# Patient Record
Sex: Female | Born: 1975 | Race: Black or African American | Hispanic: No | Marital: Single | Smoking: Never smoker
Health system: Southern US, Community
[De-identification: ages and names within clinical notes are randomized; demographics above are authoritative.]

## PROBLEM LIST (undated history)

## (undated) DIAGNOSIS — M1990 Unspecified osteoarthritis, unspecified site: Secondary | ICD-10-CM

## (undated) DIAGNOSIS — K5792 Diverticulitis of intestine, part unspecified, without perforation or abscess without bleeding: Secondary | ICD-10-CM

## (undated) DIAGNOSIS — I10 Essential (primary) hypertension: Secondary | ICD-10-CM

## (undated) HISTORY — PX: TUBAL LIGATION: SHX77

## (undated) HISTORY — PX: ENDOMETRIAL ABLATION: SHX621

## (undated) HISTORY — PX: CHOLECYSTECTOMY: SHX55

---

## 2014-04-29 ENCOUNTER — Encounter (HOSPITAL_COMMUNITY): Payer: Self-pay | Admitting: Emergency Medicine

## 2014-04-29 ENCOUNTER — Emergency Department (HOSPITAL_COMMUNITY)
Admission: EM | Admit: 2014-04-29 | Discharge: 2014-04-29 | Disposition: A | Discharge status: Home / Self Care | Payer: Medicaid Other | Attending: Emergency Medicine | Admitting: Emergency Medicine

## 2014-04-29 DIAGNOSIS — Z3202 Encounter for pregnancy test, result negative: Secondary | ICD-10-CM | POA: Insufficient documentation

## 2014-04-29 DIAGNOSIS — I10 Essential (primary) hypertension: Secondary | ICD-10-CM | POA: Insufficient documentation

## 2014-04-29 DIAGNOSIS — M549 Dorsalgia, unspecified: Secondary | ICD-10-CM | POA: Diagnosis not present

## 2014-04-29 DIAGNOSIS — R109 Unspecified abdominal pain: Secondary | ICD-10-CM | POA: Diagnosis not present

## 2014-04-29 HISTORY — DX: Essential (primary) hypertension: I10

## 2014-04-29 LAB — I-STAT CHEM 8, ED
BUN: 10 mg/dL (ref 6–23)
CREATININE: 1.1 mg/dL (ref 0.50–1.10)
Calcium, Ion: 1.2 mmol/L (ref 1.12–1.23)
Chloride: 105 mEq/L (ref 96–112)
Glucose, Bld: 109 mg/dL — ABNORMAL HIGH (ref 70–99)
HEMATOCRIT: 34 % — AB (ref 36.0–46.0)
HEMOGLOBIN: 11.6 g/dL — AB (ref 12.0–15.0)
POTASSIUM: 3.7 meq/L (ref 3.7–5.3)
Sodium: 139 mEq/L (ref 137–147)
TCO2: 23 mmol/L (ref 0–100)

## 2014-04-29 LAB — URINALYSIS, ROUTINE W REFLEX MICROSCOPIC
Bilirubin Urine: NEGATIVE
Glucose, UA: NEGATIVE mg/dL
HGB URINE DIPSTICK: NEGATIVE
Ketones, ur: NEGATIVE mg/dL
Nitrite: NEGATIVE
PH: 6 (ref 5.0–8.0)
Protein, ur: NEGATIVE mg/dL
SPECIFIC GRAVITY, URINE: 1.008 (ref 1.005–1.030)
UROBILINOGEN UA: 0.2 mg/dL (ref 0.0–1.0)

## 2014-04-29 LAB — URINE MICROSCOPIC-ADD ON

## 2014-04-29 LAB — PREGNANCY, URINE: Preg Test, Ur: NEGATIVE

## 2014-04-29 MED ORDER — OXYCODONE-ACETAMINOPHEN 5-325 MG PO TABS: 1.0000 | ORAL_TABLET | ORAL | Status: DC | PRN

## 2014-04-29 MED ORDER — OXYCODONE-ACETAMINOPHEN 5-325 MG PO TABS
1.0000 | ORAL_TABLET | Freq: Once | ORAL | Status: AC
  Administered 2014-04-29: 1 via ORAL
  Filled 2014-04-29: qty 1

## 2014-04-29 NOTE — ED Notes (Signed)
Patient in NAD at time of d/c,  

## 2014-04-29 NOTE — ED Provider Notes (Signed)
Medical screening examination/treatment/procedure(s) were performed by non-physician practitioner and as supervising physician I was immediately available for consultation/collaboration.    Linwood Dibbles, MD 04/29/14 1515

## 2014-04-29 NOTE — ED Notes (Signed)
Pt c/o left flank and back pain after starting new BP meds; pt denies urinary sx and sts feels like muscle pain

## 2014-04-29 NOTE — ED Provider Notes (Signed)
CSN: [id number]     Arrival date & time 04/29/14  0917 History   First MD Initiated Contact with Patient 04/29/14 1008     Chief Complaint  Patient presents with  . Flank Pain  . Back Pain     (Consider location/radiation/quality/duration/timing/severity/associated sxs/prior Treatment) The history is provided by the patient and medical records.   This is a 38 y.o. F with PMH significant for HTN, presenting to the ED for left flank pain for the past week after starting new blood pressure medication- Benicar.  Patient states she was previously taking losartan, however her BP was not adequately controlled so her PCP switched her meds.  States while taking the medication she has had persistent, aching pain of her left flank without radiation.  Denies injuries, trauma, or falls.  Denies urinary frequency, dysuria, or hematuria.  No nausea, vomiting, diarrhea. Denies abdominal pain.  BMs normal, no diarrhea, melena, or hematochezia.  No prior hx of kidney stones.  No fever, chills, sweats.  Patient states she called her primary care physician about this yesterday, physician called in a prescription for naproxen which she has taken once but made her break out in a rash so has not taken since.  VS stable on arrival.  Past Medical History  Diagnosis Date  . Hypertension    History reviewed. No pertinent past surgical history. History reviewed. No pertinent family history. History  Substance Use Topics  . Smoking status: Never Smoker   . Smokeless tobacco: Not on file  . Alcohol Use: No   OB History   Grav Para Term Preterm Abortions TAB SAB Ect Mult Living                 Review of Systems  All other systems reviewed and are negative.     Allergies  Naproxen  Home Medications   Prior to Admission medications   Not on File   BP 139/92  Pulse 101  Temp(Src) 98.3 F (36.8 C) (Oral)  Resp 20  Ht  (1.626 m)  Wt 275 lb (124.739 kg)  BMI 47.18 kg/m2  SpO2 98%  Physical  Exam  Nursing note and vitals reviewed. Constitutional: She is oriented to person, place, and time. She appears well-developed and well-nourished. No distress.  HENT:  Head: Normocephalic and atraumatic.  Mouth/Throat: Oropharynx is clear and moist.  Eyes: Conjunctivae and EOM are normal. Pupils are equal, round, and reactive to light.  Neck: Normal range of motion. Neck supple.  Cardiovascular: Normal rate, regular rhythm and normal heart sounds.   Pulmonary/Chest: Effort normal and breath sounds normal. No respiratory distress. She has no wheezes.  Abdominal: Soft. Bowel sounds are normal. There is no tenderness. There is no guarding and no CVA tenderness.  Endorses pain of left flank and CVA region, no focal tenderness  Musculoskeletal: Normal range of motion. She exhibits no edema.  Neurological: She is alert and oriented to person, place, and time.  Skin: Skin is warm and dry. She is not diaphoretic.  Psychiatric: She has a normal mood and affect.    ED Course  Procedures (including critical care time) Labs Review Labs Reviewed  URINALYSIS, ROUTINE W REFLEX MICROSCOPIC - Abnormal; Notable for the following:    Leukocytes, UA TRACE (*)    All other components within normal limits  URINE MICROSCOPIC-ADD ON - Abnormal; Notable for the following:    Bacteria, UA FEW (*)    All other components within normal limits  I-STAT CHEM 8, ED -  Abnormal; Notable for the following:    Glucose, Bld 109 (*)    Hemoglobin 11.6 (*)    HCT 34.0 (*)    All other components within normal limits  PREGNANCY, URINE    Imaging Review No results found.   EKG Interpretation None      MDM   Final diagnoses:  Left flank pain   38 y.o. F with left flank pain after starting benicar 1 week ago.  Denies urinary sx, fever, or hx of kidney stones.  On exam, patient afebrile and non-toxic appearing.  Endorses pain of left flank but no focal tenderness.  Given change in BP meds, will send u/a and  check renal function.    Lab work reassuring.  According to benicar side effect profile, may cause back pain as well as muscle aches which may be what patient is experiencing.  After dose of percocet she is feeling much better.  Will d/c home with same.  She states she is unhappy with her PCP and would like to change, resource guide attached to help with this.  I have encouraged her to stay complaint with her BP meds until sees new physician.  Discussed plan with patient, he/she acknowledged understanding and agreed with plan of care.  Return precautions given for new or worsening symptoms.  Garlon Hatchet, PA-C 04/29/14 1210

## 2014-04-29 NOTE — Discharge Instructions (Signed)
Take the prescribed medication as directed. Follow-up with a new primary care physician in the area if you wish.  Resource guide attached to help with this.  [place] also have primary care services. Return to the ED for new or worsening symptoms.   Emergency Department Resource Guide 1) Find a Doctor and Pay Out of Pocket Although you won't have to find out who is covered by your insurance plan, it is a good idea to ask around and get recommendations. You will then need to call the office and see if the doctor you have chosen will accept you as a new patient and what types of options they offer for patients who are self-pay. Some doctors offer discounts or will set up payment plans for their patients who do not have insurance, but you will need to ask so you aren't surprised when you get to your appointment.  2) Contact Your Local Health Department Not all health departments have doctors that can see patients for sick visits, but many do, so it is worth a call to see if yours does. If you don't know where your local health department is, you can check in your phone book. The CDC also has a tool to help you locate your state's health department, and many state websites also have listings of all of their local health departments.  3) Find a Walk-in Clinic If your illness is not likely to be very severe or complicated, you may want to try a walk in clinic. These are popping up all over the country in pharmacies, drugstores, and shopping centers. They're usually staffed by nurse practitioners or physician assistants that have been trained to treat common illnesses and complaints. They're usually fairly quick and inexpensive. However, if you have serious medical issues or chronic medical problems, these are probably not your best option.  No Primary Care Doctor: - Call Health Connect at  [phone] - they can help you locate a primary care doctor that  accepts your insurance, provides certain  services, etc. - Physician Referral Service- [phone]  Chronic Pain Problems: Organization         Address  Phone   Notes  [facility]  [phone] Patients need to be referred by their primary care doctor.   Medication Assistance: Organization         Address  Phone   Notes  [facility] Medication [facility] [address] [place] [place], [place] [address] [phone] --Must be a resident of [facility] -- Must have NO insurance coverage whatsoever (no Medicaid/ Medicare, etc.) -- The pt. MUST have a primary care doctor that directs their care regularly and follows them in the community   [facility]ist  [phone]   [employer]  [phone]    Agencies that provide inexpensive medical care: Organization         Address  Phone   Notes  [facility]  [phone]   [facility]    [phone]   [facility] [address] [place], [place] [address] [phone]   [facility] [address] [place] [place] [phone]   Planned Parenthood    [phone]   [facility]    [phone]   [facility]  [address], [place] Phone:  [phone], Fax:  [phone] Hours of Operation:  9 am - 6 pm, M-F.  Also accepts Medicaid/Medicare and self-pay.  [facility] for [place] [place], [address], [place] Phone: [phone], Fax: [phone]. Hours of Operation:  8:30 am - 5:30 pm, M-F.  Also accepts Medicaid and self-pay.  [facility] [address] [place] Phone: [phone]   [place], [place], [place] [phone], Ext. [phone] Mondays & Thursdays: 7-9 AM.  First 15 patients are seen on a first come, first serve basis.    [place] Providers:  Organization         Address  Phone   Notes  [facility] [address], [place] [phone] Also accepts self-pay patients.  [facility] [address] [place], [place]  [phone]   [place], [address], [place] [phone]   [facility] [address], [place] [phone]   Lucianne Lei [address], [place]   [phone] Only accepts [place] Access [place] patients after they have their name applied to their card.   Self-Pay (no insurance) in [facility] Xxx:  Organization         Address  Phone   Notes  Sickle Cell Patients, [facility] Internal Medicine [place] [phone]   [facility] [place] [phone]   [facility] [place]  [place], [place], [place] [phone]   [facility]/Dr. Osei-Bonsu  [address] [place] or [address], [place] [phone] Phone number for both [place] and [place] locations is the same.  Urgent Medical and [facility] [address], [place] [phone]   [facility] [address] [place] or [address] [phone]   [facility] [address] [place] [phone], phone; [phone], fax Sees patients 1st and 3rd Saturday of every month.  Must not qualify for public or private insurance (i.e. Medicaid, Medicare, [place] Health Choice, Veterans' Benefits)  Household income should be no more than 200% of the poverty level The clinic cannot treat you if you are pregnant or think you are pregnant  Sexually transmitted diseases are not treated at the clinic.    Dental Care: Organization         Address  Phone  Notes  [facility] Department of [facility] [phone] Accepts  children up to age 57 who are enrolled in [place] or [place]; pregnant women with a Medicaid card; and children who have applied for Medicaid or [place] Health Choice, but were declined, whose parents can pay a reduced fee at time of service.  [facility] Department of [facility]  [address], [place] [phone] Accepts children up to age 80 who are enrolled in [place] or [place]; pregnant women with a Medicaid card; and children who have applied for Medicaid or [place] Health Choice, but were declined, whose parents can pay a reduced fee at time of service.  [facility] PRO[facility]  [place] [phone] Patients are seen by appointment only. Walk-ins are not accepted. [place] will see patients 42 years of age and older. Monday - Tuesday (  8am-5pm) Most Wednesdays (8:30-5pm) $30 per visit, cash only  [facility] Adult Dental Access PROGRAM  [address], [facility] [phone] Patients are seen by appointment only. Walk-ins are not accepted. [place] will see patients 78 years of age and older. One Wednesday Evening (Monthly: Volunteer Based).  $30 per visit, cash only  [place]  [phone] for adults; Children under age 55, call Graduate Pediatric Dentistry at [phone]. Children aged 17-14, please call [phone] to request a pediatric application.  Dental services are provided in all areas of dental care including fillings, crowns and bridges, complete and partial dentures, implants, gum treatment, root canals, and extractions. Preventive care is also provided. Treatment is provided to both adults and children. Patients are selected via a lottery and there is often a waiting list.   [facility] [address], [place]  [phone] [email or web address]   [facility] [address] [place], [place] [phone], Ext. [phone] Second and  Fourth Thursday of each month, opens at 6:30 AM; Clinic ends at 9 AM.  Patients are seen on a first-come first-served basis, and a limited number are seen during each clinic.   [facility]  [address] Hillard Danker [place], [place] [phone]   Eligibility Requirements You must have lived in [place], [place], or [place] counties for at least the last three months.   You cannot be eligible for state or federal sponsored [employer], including [employer], [place], or [employer].   You generally cannot be eligible for healthcare insurance through your employer.    How to apply: Eligibility screenings are held every Tuesday and Wednesday afternoon from 1:00 pm until 4:00 pm. You do not need an appointment for the interview!  [facility] [address] [place], [place]   Head of the [place]  [place] Department  [place]  [phone]    Behavioral Health Resources in the Community: Intensive Outpatient Programs Organization         Address  Phone  Notes  [place]. [address] [place], [place] [phone]   [facility] [address], [place], [place]   ADS: Alcohol & Drug Svcs [address] [place], [place]   [place] [address],  [place], [place] or [phone]   Substance Abuse Resources Organization         Address  Phone  Notes  Alcohol and Drug Services  [phone]   [place]  [phone]   [facility] [place]   Chinita Pester  [phone]   Residential & Outpatient Substance Abuse Program  [phone]   Psychological Services Organization         Address  Phone  Notes  [facility] [place]  [place]  [phone]   [place]  [address], [place] or [phone]    Mobile Crisis Teams Organization         Address  Phone  Notes  [facility], Mobile Crisis Care Unit  [phone]   [facility]  [address] [place], [place]   [place] [address], [place] [phone]    Self-Help/Support Groups Organization         Address  Phone  Notes  [facility]ental [facility]oc[facility] of [place] - variety of support groups  336- I7437963 Call for more information  Narcotics Anonymous (NA), [facility] [address], [employer] [place]  2 meetings at this location   Statistician         Address  Phone  Notes  [facility] [address],    [place]  [phone]   [facility]  [address] [place] [address], [place], [place] [phone]   [facility] [address] [place], [place] [phone] Admissions: 8am-3pm M-F  [facility] [address],    [place], [place] [phone]   [facility] [address] [place], [place], [place] [phone]   [facility] [address],  [place], [place] [phone]   [facility] - Intensive Outpatient [address], [place], [place] [phone]   [facility] (Addiction Recovery [facility] [address] [place].,  [place], [place] [phone] or [phone]   [facility] Services (RTS) [address], [place], [place] [phone] Accepts Medicaid  [facility] [place] [address]  [place] [phone] Substance Abuse/Addiction Treatment   [facility] Organization         Address  Phone  Notes  [facility]  [phone]   Angie Fava, PhD [address] Ervin Knack [place], [place]   [phone] or [phone]   [facility] Behavioral   [address] [place], [place] [phone]   [facility] [address] [place], [place] [phone] Insurance/Medicaid/sponsorship through [facility] and Families [address]                                    [place], [place] [phone] Therapy/tele-psych/case  [facility] [address] [place] [address], [place] [phone]    Dr. Lolly Mustache  [phone]   [facility]  [employer] [facility] Dept. 1) [address], [place] 2) [address], [place] 3)  [address], [place] [phone]  [phone]   [facility] Child Abuse Hotline 330-224-7062 or [phone] (After Hours)

## 2014-04-30 ENCOUNTER — Emergency Department (HOSPITAL_COMMUNITY): Payer: Medicaid Other

## 2014-04-30 ENCOUNTER — Emergency Department (HOSPITAL_COMMUNITY)
Admission: EM | Admit: 2014-04-30 | Discharge: 2014-04-30 | Disposition: A | Discharge status: Home / Self Care | Payer: Medicaid Other | Attending: Emergency Medicine | Admitting: Emergency Medicine

## 2014-04-30 ENCOUNTER — Encounter (HOSPITAL_COMMUNITY): Payer: Self-pay | Admitting: Neurology

## 2014-04-30 DIAGNOSIS — Z79899 Other long term (current) drug therapy: Secondary | ICD-10-CM | POA: Insufficient documentation

## 2014-04-30 DIAGNOSIS — I10 Essential (primary) hypertension: Secondary | ICD-10-CM | POA: Insufficient documentation

## 2014-04-30 DIAGNOSIS — F8081 Childhood onset fluency disorder: Secondary | ICD-10-CM

## 2014-04-30 DIAGNOSIS — R200 Anesthesia of skin: Secondary | ICD-10-CM

## 2014-04-30 DIAGNOSIS — R209 Unspecified disturbances of skin sensation: Secondary | ICD-10-CM | POA: Insufficient documentation

## 2014-04-30 DIAGNOSIS — R4701 Aphasia: Secondary | ICD-10-CM | POA: Insufficient documentation

## 2014-04-30 LAB — CBC
HEMATOCRIT: 33.1 % — AB (ref 36.0–46.0)
Hemoglobin: 11.2 g/dL — ABNORMAL LOW (ref 12.0–15.0)
MCH: 27.3 pg (ref 26.0–34.0)
MCHC: 33.8 g/dL (ref 30.0–36.0)
MCV: 80.5 fL (ref 78.0–100.0)
Platelets: 235 10*3/uL (ref 150–400)
RBC: 4.11 MIL/uL (ref 3.87–5.11)
RDW: 13.9 % (ref 11.5–15.5)
WBC: 6.4 10*3/uL (ref 4.0–10.5)

## 2014-04-30 LAB — COMPREHENSIVE METABOLIC PANEL
ALT: 15 U/L (ref 0–35)
AST: 18 U/L (ref 0–37)
Albumin: 3.3 g/dL — ABNORMAL LOW (ref 3.5–5.2)
Alkaline Phosphatase: 74 U/L (ref 39–117)
Anion gap: 11 (ref 5–15)
BILIRUBIN TOTAL: 0.2 mg/dL — AB (ref 0.3–1.2)
BUN: 11 mg/dL (ref 6–23)
CALCIUM: 9 mg/dL (ref 8.4–10.5)
CO2: 24 mEq/L (ref 19–32)
Chloride: 104 mEq/L (ref 96–112)
Creatinine, Ser: 1.11 mg/dL — ABNORMAL HIGH (ref 0.50–1.10)
GFR, EST AFRICAN AMERICAN: 72 mL/min — AB (ref >90)
GFR, EST NON AFRICAN AMERICAN: 62 mL/min — AB (ref >90)
GLUCOSE: 105 mg/dL — AB (ref 70–99)
Potassium: 3.6 mEq/L — ABNORMAL LOW (ref 3.7–5.3)
SODIUM: 139 meq/L (ref 137–147)
Total Protein: 7.1 g/dL (ref 6.0–8.3)

## 2014-04-30 LAB — URINE MICROSCOPIC-ADD ON

## 2014-04-30 LAB — URINALYSIS, ROUTINE W REFLEX MICROSCOPIC
BILIRUBIN URINE: NEGATIVE
Glucose, UA: NEGATIVE mg/dL
Hgb urine dipstick: NEGATIVE
Ketones, ur: NEGATIVE mg/dL
NITRITE: NEGATIVE
PH: 6 (ref 5.0–8.0)
Protein, ur: NEGATIVE mg/dL
SPECIFIC GRAVITY, URINE: 1.008 (ref 1.005–1.030)
Urobilinogen, UA: 0.2 mg/dL (ref 0.0–1.0)

## 2014-04-30 LAB — CBG MONITORING, ED: Glucose-Capillary: 107 mg/dL — ABNORMAL HIGH (ref 70–99)

## 2014-04-30 LAB — RAPID URINE DRUG SCREEN, HOSP PERFORMED
Amphetamines: NOT DETECTED
BARBITURATES: NOT DETECTED
Benzodiazepines: NOT DETECTED
COCAINE: NOT DETECTED
Opiates: NOT DETECTED
Tetrahydrocannabinol: NOT DETECTED

## 2014-04-30 LAB — DIFFERENTIAL
Basophils Absolute: 0 10*3/uL (ref 0.0–0.1)
Basophils Relative: 0 % (ref 0–1)
EOS PCT: 1 % (ref 0–5)
Eosinophils Absolute: 0.1 10*3/uL (ref 0.0–0.7)
Lymphocytes Relative: 23 % (ref 12–46)
Lymphs Abs: 1.5 10*3/uL (ref 0.7–4.0)
Monocytes Absolute: 0.4 10*3/uL (ref 0.1–1.0)
Monocytes Relative: 6 % (ref 3–12)
Neutro Abs: 4.5 10*3/uL (ref 1.7–7.7)
Neutrophils Relative %: 70 % (ref 43–77)

## 2014-04-30 LAB — I-STAT CHEM 8, ED
BUN: 10 mg/dL (ref 6–23)
CREATININE: 1.2 mg/dL — AB (ref 0.50–1.10)
Calcium, Ion: 1.35 mmol/L — ABNORMAL HIGH (ref 1.12–1.23)
Chloride: 102 mEq/L (ref 96–112)
GLUCOSE: 109 mg/dL — AB (ref 70–99)
HCT: 37 % (ref 36.0–46.0)
HEMOGLOBIN: 12.6 g/dL (ref 12.0–15.0)
Potassium: 3.4 mEq/L — ABNORMAL LOW (ref 3.7–5.3)
SODIUM: 141 meq/L (ref 137–147)
TCO2: 24 mmol/L (ref 0–100)

## 2014-04-30 LAB — I-STAT TROPONIN, ED: Troponin i, poc: 0 ng/mL (ref 0.00–0.08)

## 2014-04-30 LAB — APTT: aPTT: 32 seconds (ref 24–37)

## 2014-04-30 LAB — PROTIME-INR
INR: 1.16 (ref 0.00–1.49)
Prothrombin Time: 14.8 seconds (ref 11.6–15.2)

## 2014-04-30 LAB — ETHANOL: Alcohol, Ethyl (B): <11 mg/dL (ref 0–11)

## 2014-04-30 MED ORDER — METOCLOPRAMIDE HCL 5 MG/ML IJ SOLN: 10.0000 mg | Freq: Once | INTRAMUSCULAR | Status: DC

## 2014-04-30 MED ORDER — METOCLOPRAMIDE HCL 5 MG/ML IJ SOLN
10.0000 mg | Freq: Once | INTRAMUSCULAR | Status: AC
  Administered 2014-04-30: 10 mg via INTRAMUSCULAR
  Filled 2014-04-30: qty 2

## 2014-04-30 NOTE — Discharge Instructions (Signed)
Recommend following up with a new PCP in the area as we discussed yesterday. Return to the ED for new concerns.

## 2014-04-30 NOTE — ED Notes (Signed)
Patient in NAD at time of d/c 

## 2014-04-30 NOTE — ED Notes (Signed)
PT lsn 1230 today.  States just started taking benicar yesterday and at 1230 began having difficulty speaking and R leg and R face numbness.

## 2014-04-30 NOTE — Code Documentation (Signed)
38yo female arriving to [facility] via private vehicle at 1239.  Patient reports that she was riding in the car with a neighbor when she had sudden onset bilateral jaw tingling and stuttering speech.  Patient reports that she was seen by a physician yesterday for back pain and was prescribed an antihypertensive.  Patient took the new medication this morning at 1000.  Patient taken to CT on arrival.  Initial NIHSS 0.  Patient initially stuttering with speech, but now speech is clear.  When back to room patient again having stuttering speech with neighbor now at the bedside.  Dr. Amada Jupiter to room and speech again clear.  Patient reporting recent stressors to MD as well as headaches.  Patient to get MRI.  Code Stroke canceled per MD.  Bedside handoff with ED RN Lorin Picket.

## 2014-04-30 NOTE — ED Provider Notes (Signed)
Care assumed from Sharilyn Sites, PA-C.  Sabrina Bond is a 38 y.o. female presents with hx of HTN and c/o R leg and R face numbness onset 12:30PM immediately after taking her Benicar. Pt was seen and evaluated for myalgias and flank pain yesterday likely 2/2 to her Benicar usage.    Physical Exam  BP 141/72  Pulse 67  Temp(Src) 98.1 F (36.7 C) (Oral)  Resp 12  SpO2 100%  Physical Exam  Face to face Exam:   General: Awake  HEENT: Atraumatic  Resp: Normal effort  Abd: Nondistended  Neuro:No focal weakness  Lymph: No adenopathy MSK: moving all extremities equally without ataxia    ED Course  Procedures  Results for orders placed during the hospital encounter of 04/30/14  ETHANOL      Result Value Ref Range   Alcohol, Ethyl (B) <11  0 - 11 mg/dL  PROTIME-INR      Result Value Ref Range   Prothrombin Time 14.8  11.6 - 15.2 seconds   INR 1.16  0.00 - 1.49  APTT      Result Value Ref Range   aPTT 32  24 - 37 seconds  CBC      Result Value Ref Range   WBC 6.4  4.0 - 10.5 K/uL   RBC 4.11  3.87 - 5.11 MIL/uL   Hemoglobin 11.2 (*) 12.0 - 15.0 g/dL   HCT 40.9 (*) 81.1 - 91.4 %   MCV 80.5  78.0 - 100.0 fL   MCH 27.3  26.0 - 34.0 pg   MCHC 33.8  30.0 - 36.0 g/dL   RDW 78.2  95.6 - 21.3 %   Platelets 235  150 - 400 K/uL  DIFFERENTIAL      Result Value Ref Range   Neutrophils Relative % 70  43 - 77 %   Neutro Abs 4.5  1.7 - 7.7 K/uL   Lymphocytes Relative 23  12 - 46 %   Lymphs Abs 1.5  0.7 - 4.0 K/uL   Monocytes Relative 6  3 - 12 %   Monocytes Absolute 0.4  0.1 - 1.0 K/uL   Eosinophils Relative 1  0 - 5 %   Eosinophils Absolute 0.1  0.0 - 0.7 K/uL   Basophils Relative 0  0 - 1 %   Basophils Absolute 0.0  0.0 - 0.1 K/uL  COMPREHENSIVE METABOLIC PANEL      Result Value Ref Range   Sodium 139  137 - 147 mEq/L   Potassium 3.6 (*) 3.7 - 5.3 mEq/L   Chloride 104  96 - 112 mEq/L   CO2 24  19 - 32 mEq/L   Glucose, Bld 105 (*) 70 - 99 mg/dL   BUN 11  6 - 23 mg/dL   Creatinine, Ser 0.86 (*) 0.50 - 1.10 mg/dL   Calcium 9.0  8.4 - 57.8 mg/dL   Total Protein 7.1  6.0 - 8.3 g/dL   Albumin 3.3 (*) 3.5 - 5.2 g/dL   AST 18  0 - 37 U/L   ALT 15  0 - 35 U/L   Alkaline Phosphatase 74  39 - 117 U/L   Total Bilirubin 0.2 (*) 0.3 - 1.2 mg/dL   GFR calc non Af Amer 62 (*) >90 mL/min   GFR calc Af Amer 72 (*) >90 mL/min   Anion gap 11  5 - 15  I-STAT CHEM 8, ED      Result Value Ref Range   Sodium 141  137 -  147 mEq/L   Potassium 3.4 (*) 3.7 - 5.3 mEq/L   Chloride 102  96 - 112 mEq/L   BUN 10  6 - 23 mg/dL   Creatinine, Ser 1.61 (*) 0.50 - 1.10 mg/dL   Glucose, Bld 096 (*) 70 - 99 mg/dL   Calcium, Ion 0.45 (*) 1.12 - 1.23 mmol/L   TCO2 24  0 - 100 mmol/L   Hemoglobin 12.6  12.0 - 15.0 g/dL   HCT 40.9  81.1 - 91.4 %  I-STAT TROPOININ, ED      Result Value Ref Range   Troponin i, poc 0.00  0.00 - 0.08 ng/mL   Comment 3           CBG MONITORING, ED      Result Value Ref Range   Glucose-Capillary 107 (*) 70 - 99 mg/dL   Comment 1 Documented in Chart     Comment 2 Notify RN     Ct Head Wo Contrast  04/30/2014   CLINICAL DATA:  Right facial numbness. Right lower extremity numbness. Slurred speech.  EXAM: CT HEAD WITHOUT CONTRAST  TECHNIQUE: Contiguous axial images were obtained from the base of the skull through the vertex without intravenous contrast.  COMPARISON:  None.  FINDINGS: No acute intracranial abnormality. Specifically, no hemorrhage, hydrocephalus, mass lesion, acute infarction, or significant intracranial injury. No acute calvarial abnormality. Visualized paranasal sinuses and mastoids clear. Orbital soft tissues unremarkable.  IMPRESSION: Negative.   Electronically Signed   By: Charlett Nose M.D.   On: 04/30/2014 13:02   Mr Brain Wo Contrast  04/30/2014   CLINICAL DATA:  Stuttering speech in right leg and facial numbness  EXAM: MRI HEAD WITHOUT CONTRAST  TECHNIQUE: Multiplanar, multiecho pulse sequences of the brain and surrounding structures were  obtained without intravenous contrast.  COMPARISON:  Head CT from the same day  FINDINGS: Calvarium and upper cervical spine: No marrow signal abnormality.  Orbits: No significant findings.  Sinuses: Clear. Mastoid and middle ears are clear.  Brain: No acute abnormality such as acute infarct, hemorrhage, hydrocephalus, or mass lesion. The major intracranial vessels appear diminutive, especially on axial T2 weighted imaging. Appearance on coronal imaging and diffuse symmetry argues against a focal dissection or Moya moya. Mild cerebellar tonsillar ectopia. No foramen magnum crowding typical of Chiari 1 malformation.  The suprachiasmatic recesses is prominent but there is no evidence of third ventricular obstruction or other suprasellar abnormality. This could represent a small intraventricular cyst.  IMPRESSION: No infarct or other acute intracranial disease.   Electronically Signed   By: Tiburcio Pea M.D.   On: 04/30/2014 16:07      MDM Presents with c/o aphasia, and RUE, RLE weakness.  Pt with normal neurologic exam on initial evaluation and ability to speak in full sentences.    Plan: Pt has been seen by Neurology who recommends MRI and d/c home if negative.    4:27 PM MRI negative.  Pt to be d/c home with neurology follow-up.    BP 141/72  Pulse 67  Temp(Src) 98.1 F (36.7 C) (Oral)  Resp 12  SpO2 100%          Dierdre Forth, PA-C 05/01/14 [phone]

## 2014-04-30 NOTE — ED Provider Notes (Signed)
CSN: [id number]     Arrival date & time 04/30/14  1239 History   First MD Initiated Contact with Patient 04/30/14 1251     Chief Complaint  Patient presents with  . Aphasia     (Consider location/radiation/quality/duration/timing/severity/associated sxs/prior Treatment) The history is provided by the patient and medical records.   This is a 38 year old female with past medical history significant for hypertension, presenting to the ED for stuttering speech and right leg and facial numbness. CODE STROKE activated in triage. Patient was last seen normal at 12:30 today.  Patient familiar to myself from ED visit yesterday. She was recently switched from Losartan to Benicar by PCP due to inadequate control of blood pressure.  After yesterday's visit, recommended continuing her Benicar as risk outweighs benefit at this time.  Patient states she took her last dose around 1230 and shortly afterwards developed aphasia and word finding difficulties, as well as right facial and leg numbness.  Denies unilateral weakness.  No chest pain, SOB, palpitations, dizziness, weakness, visual disturbance.  Past Medical History  Diagnosis Date  . Hypertension    No past surgical history on file. No family history on file. History  Substance Use Topics  . Smoking status: Never Smoker   . Smokeless tobacco: Not on file  . Alcohol Use: No   OB History   Grav Para Term Preterm Abortions TAB SAB Ect Mult Living                 Review of Systems  Neurological: Positive for speech difficulty and numbness.  All other systems reviewed and are negative.     Allergies  Naproxen  Home Medications   Prior to Admission medications   Medication Sig Start Date End Date Taking? Authorizing Provider  esomeprazole (NEXIUM) 40 MG capsule Take 40 mg by mouth daily as needed.    Historical Provider, MD  olmesartan-hydrochlorothiazide (BENICAR HCT) 40-12.5 MG per tablet Take 1 tablet by mouth daily.    Historical  Provider, MD  oxyCODONE-acetaminophen (PERCOCET/ROXICET) 5-325 MG per tablet Take 1 tablet by mouth every 4 (four) hours as needed. 04/29/14   Garlon Hatchet, PA-C   There were no vitals taken for this visit.  Physical Exam  Nursing note and vitals reviewed. Constitutional: She is oriented to person, place, and time. She appears well-developed and well-nourished. No distress.  HENT:  Head: Normocephalic and atraumatic.  Mouth/Throat: Oropharynx is clear and moist.  Eyes: Conjunctivae and EOM are normal. Pupils are equal, round, and reactive to light.  Neck: Normal range of motion. Neck supple.  Cardiovascular: Normal rate, regular rhythm and normal heart sounds.   Pulmonary/Chest: Effort normal and breath sounds normal. No respiratory distress. She has no wheezes.  Abdominal: Soft. Bowel sounds are normal. There is no tenderness. There is no guarding.  Musculoskeletal: Normal range of motion.  Neurological: She is alert and oriented to person, place, and time.  AAOx3, answering questions and following commands appropriately; equal strength UE and LE bilaterally; CN grossly intact; moves all extremities appropriately without ataxia; normal coordination; no dysmetria with finger to nose bilaterally; no focal neuro deficits or facial asymmetry appreciated; normal sensation throughout  Skin: Skin is warm and dry. She is not diaphoretic.  Psychiatric: She has a normal mood and affect.    ED Course  Procedures (including critical care time) Labs Review Labs Reviewed  CBC - Abnormal; Notable for the following:    Hemoglobin 11.2 (*)    HCT 33.1 (*)  All other components within normal limits  COMPREHENSIVE METABOLIC PANEL - Abnormal; Notable for the following:    Potassium 3.6 (*)    Glucose, Bld 105 (*)    Creatinine, Ser 1.11 (*)    Albumin 3.3 (*)    Total Bilirubin 0.2 (*)    GFR calc non Af Amer 62 (*)    GFR calc Af Amer 72 (*)    All other components within normal limits   I-STAT CHEM 8, ED - Abnormal; Notable for the following:    Potassium 3.4 (*)    Creatinine, Ser 1.20 (*)    Glucose, Bld 109 (*)    Calcium, Ion 1.35 (*)    All other components within normal limits  CBG MONITORING, ED - Abnormal; Notable for the following:    Glucose-Capillary 107 (*)    All other components within normal limits  ETHANOL  PROTIME-INR  APTT  DIFFERENTIAL  URINE RAPID DRUG SCREEN (HOSP PERFORMED)  URINALYSIS, ROUTINE W REFLEX MICROSCOPIC  I-STAT TROPOININ, ED  I-STAT TROPOININ, ED    Imaging Review Ct Head Wo Contrast  04/30/2014   CLINICAL DATA:  Right facial numbness. Right lower extremity numbness. Slurred speech.  EXAM: CT HEAD WITHOUT CONTRAST  TECHNIQUE: Contiguous axial images were obtained from the base of the skull through the vertex without intravenous contrast.  COMPARISON:  None.  FINDINGS: No acute intracranial abnormality. Specifically, no hemorrhage, hydrocephalus, mass lesion, acute infarction, or significant intracranial injury. No acute calvarial abnormality. Visualized paranasal sinuses and mastoids clear. Orbital soft tissues unremarkable.  IMPRESSION: Negative.   Electronically Signed   By: Charlett Nose M.D.   On: 04/30/2014 13:02     EKG Interpretation None      MDM   Final diagnoses:  Numbness  Stuttering   38 year old female last seen normal at 12:30, presenting with stuttering speech and right facial and leg numbness. Neurologic exam is nonfocal, stroke score 0.  CT head negative for acute findings.  Lab work is reassuring.  Neurology has evaluated patient, all sx seem to have resolved at this time.  Were planning for discharge home, however patient began complaining of recurrent numbness and stuttering speech.  Will plan for MRI.  Per neurology, if MRI negative, safe to be discharged home without further work-up.    Care signed out to PA Muthersbaugh at change of shift.  Will follow MRI results and dispo accordingly.  Garlon Hatchet,  PA-C 04/30/14 1536  Garlon Hatchet, PA-C 04/30/14 1537

## 2014-04-30 NOTE — Consult Note (Signed)
Referring Physician: Littie Deeds    Chief Complaint:  Code stroke  HPI:                                                                                                                                         Sabrina Bond is an 38 y.o. female who woke up this AM feeling fine.  She took her medication at 1000 AM and about one hour later got in car with friend.  She became light headed in car.  On the way she was trying to tell friend her feet were tingling but could not get words out.  She then noted bilateral lower jaw felt tingling.  Her friend brought her to ED.  On arrival she showed stuttering speech at times and at other times she would speak clearly. Her jaw numbness and lower extremity numbness has resolved.   She does endorse occasional severe headaches that are unilateral and throbbing in character associated with photophobia. She denies previous aura.  Date last known well: Date: 04/30/2014 Time last known well: Time: 12:20 tPA Given: No: resolving symptoms  Past Medical History  Diagnosis Date  . Hypertension     No past surgical history on file.  Family History  Problem Relation Age of Onset  . Hypertension Mother   . Hypertension Father    Social History:  reports that she has never smoked. She does not have any smokeless tobacco history on file. She reports that she does not drink alcohol or use illicit drugs.  Allergies:  Allergies  Allergen Reactions  . Naproxen Itching and Rash    Medications:                                                                                                                           No current facility-administered medications for this encounter.   Current Outpatient Prescriptions  Medication Sig Dispense Refill  . esomeprazole (NEXIUM) 40 MG capsule Take 40 mg by mouth daily as needed.      Marland Kitchen olmesartan-hydrochlorothiazide (BENICAR HCT) 40-12.5 MG per tablet Take 1 tablet by mouth daily.      Marland Kitchen oxyCODONE-acetaminophen (PERCOCET/ROXICET)  5-325 MG per tablet Take 1 tablet by mouth every 4 (four) hours as needed.  15 tablet  0     ROS:  History obtained from the patient  General ROS: negative for - chills, fatigue, fever, night sweats, weight gain or weight loss Psychological ROS: negative for - behavioral disorder, hallucinations, memory difficulties, mood swings or suicidal ideation Ophthalmic ROS: negative for - blurry vision, double vision, eye pain or loss of vision ENT ROS: negative for - epistaxis, nasal discharge, oral lesions, sore throat, tinnitus or vertigo Allergy and Immunology ROS: negative for - hives or itchy/watery eyes Hematological and Lymphatic ROS: negative for - bleeding problems, bruising or swollen lymph nodes Endocrine ROS: negative for - galactorrhea, hair pattern changes, polydipsia/polyuria or temperature intolerance Respiratory ROS: negative for - cough, hemoptysis, shortness of breath or wheezing Cardiovascular ROS: negative for - chest pain, dyspnea on exertion, edema or irregular heartbeat Gastrointestinal ROS: negative for - abdominal pain, diarrhea, hematemesis, nausea/vomiting or stool incontinence Genito-Urinary ROS: negative for - dysuria, hematuria, incontinence or urinary frequency/urgency Musculoskeletal ROS: negative for - joint swelling or muscular weakness Neurological ROS: as noted in HPI Dermatological ROS: negative for rash and skin lesion changes  Neurologic Examination:                                                                                                      There were no vitals taken for this visit.  General: NAD Mental Status: Alert, oriented, thought content appropriate.  Speech fluent without evidence of aphasia.  Able to follow 3 step commands without difficulty. Cranial Nerves: II: Discs flat bilaterally; Visual fields grossly  normal, pupils equal, round, reactive to light and accommodation III,IV, VI: ptosis not present, extra-ocular motions intact bilaterally V,VII: smile symmetric, facial light touch sensation normal bilaterally VIII: hearing normal bilaterally IX,X: gag reflex present XI: bilateral shoulder shrug XII: midline tongue extension without atrophy or fasciculations  Motor: Right : Upper extremity   5/5    Left:     Upper extremity   5/5  Lower extremity   5/5     Lower extremity   5/5 Tone and bulk:normal tone throughout; no atrophy noted Sensory: Pinprick and light touch intact throughout, bilaterally Deep Tendon Reflexes:  Right: Upper Extremity   Left: Upper extremity   biceps (C-5 to C-6) 2/4   biceps (C-5 to C-6) 2/4 tricep (C7) 2/4    triceps (C7) 2/4 Brachioradialis (C6) 2/4  Brachioradialis (C6) 2/4  Lower Extremity Lower Extremity  quadriceps (L-2 to L-4) 2/4   quadriceps (L-2 to L-4) 2/4 Achilles (S1) 2/4   Achilles (S1) 2/4  Plantars: Right: downgoing   Left: downgoing Cerebellar: normal finger-to-nose,  normal heel-to-shin test Gait: not tested CV: pulses palpable throughout    Lab Results: Basic Metabolic Panel:  Recent Labs Lab 04/29/14 1052  NA 139  K 3.7  CL 105  GLUCOSE 109*  BUN 10  CREATININE 1.10    Liver Function Tests: No results found for this basename: AST, ALT, ALKPHOS, BILITOT, PROT, ALBUMIN,  in the last 168 hours No results found for this basename: LIPASE, AMYLASE,  in the last 168 hours No results found for this basename: AMMONIA,  in the last 168 hours  CBC:  Recent Labs Lab 04/29/14 1052  HGB 11.6*  HCT 34.0*    Cardiac Enzymes: No results found for this basename: CKTOTAL, CKMB, CKMBINDEX, TROPONINI,  in the last 168 hours  Lipid Panel: No results found for this basename: CHOL, TRIG, HDL, CHOLHDL, VLDL, LDLCALC,  in the last 168 hours  CBG:  Recent Labs Lab 04/30/14 1244  GLUCAP 107*    Microbiology: No results found  for this or any previous visit.  Coagulation Studies: No results found for this basename: LABPROT, INR,  in the last 72 hours  Imaging: Ct Head Wo Contrast  04/30/2014   CLINICAL DATA:  Right facial numbness. Right lower extremity numbness. Slurred speech.  EXAM: CT HEAD WITHOUT CONTRAST  TECHNIQUE: Contiguous axial images were obtained from the base of the skull through the vertex without intravenous contrast.  COMPARISON:  None.  FINDINGS: No acute intracranial abnormality. Specifically, no hemorrhage, hydrocephalus, mass lesion, acute infarction, or significant intracranial injury. No acute calvarial abnormality. Visualized paranasal sinuses and mastoids clear. Orbital soft tissues unremarkable.  IMPRESSION: Negative.   Electronically Signed   By: Charlett Nose M.D.   On: 04/30/2014 13:02       Assessment and plan discussed with with attending physician and they are in agreement.    Felicie Morn PA-C [facility] [phone]  04/30/2014, 1:06 PM   Assessment: 38 y.o. female brought to ED as code stroke due to bilateral jaw tingling and stuttering speech.  Symptoms have now fully resolved. Head CT negative for stroke or bleed. Her stuttering speech seems psychogenic in nature, though with her headache it is possible that she has complicated migraine.  Stroke Risk Factors - hypertension  1) MRI brain, if negative no further workup 2) could use Reglan 10 mg IV x1 for presumed migraine  Ritta Slot, MD [facility] [phone]  If 7pm- 7am, please page neurology on call as listed in [facility].

## 2014-05-01 NOTE — ED Provider Notes (Signed)
Medical screening examination/treatment/procedure(s) were performed by non-physician practitioner and as supervising physician I was immediately available for consultation/collaboration.   EKG Interpretation   Date/Time:  Friday April 30 2014 13:39:09 EDT Ventricular Rate:  69 PR Interval:  151 QRS Duration: 77 QT Interval:  397 QTC Calculation: 425 R Axis:   27 Text Interpretation:  Sinus rhythm Baseline wander in lead(s) V1 No old  tracing to compare Confirmed by Mirian Mo [phone]) on 04/30/2014  1:48:14 PM       Ethelda Chick, MD 05/01/14 [phone]

## 2014-05-01 NOTE — ED Provider Notes (Signed)
Medical screening examination/treatment/procedure(s) were performed by non-physician practitioner and as supervising physician I was immediately available for consultation/collaboration.   EKG Interpretation   Date/Time:  Friday April 30 2014 13:39:09 EDT Ventricular Rate:  69 PR Interval:  151 QRS Duration: 77 QT Interval:  397 QTC Calculation: 425 R Axis:   27 Text Interpretation:  Sinus rhythm Baseline wander in lead(s) V1 No old  tracing to compare Confirmed by Mirian Mo [phone]) on 04/30/2014  1:48:14 PM        Mirian Mo, MD 05/01/14 [phone]

## 2014-05-10 ENCOUNTER — Emergency Department (HOSPITAL_COMMUNITY)
Admission: EM | Admit: 2014-05-10 | Discharge: 2014-05-11 | Disposition: A | Discharge status: Home / Self Care | Payer: Medicaid Other | Attending: Emergency Medicine | Admitting: Emergency Medicine

## 2014-05-10 DIAGNOSIS — Z79899 Other long term (current) drug therapy: Secondary | ICD-10-CM | POA: Diagnosis not present

## 2014-05-10 DIAGNOSIS — Z8659 Personal history of other mental and behavioral disorders: Secondary | ICD-10-CM | POA: Insufficient documentation

## 2014-05-10 DIAGNOSIS — Z3202 Encounter for pregnancy test, result negative: Secondary | ICD-10-CM | POA: Diagnosis not present

## 2014-05-10 DIAGNOSIS — I10 Essential (primary) hypertension: Secondary | ICD-10-CM | POA: Insufficient documentation

## 2014-05-10 DIAGNOSIS — R079 Chest pain, unspecified: Secondary | ICD-10-CM | POA: Diagnosis not present

## 2014-05-10 LAB — BASIC METABOLIC PANEL
ANION GAP: 11 (ref 5–15)
BUN: 12 mg/dL (ref 6–23)
CALCIUM: 8.6 mg/dL (ref 8.4–10.5)
CHLORIDE: 107 meq/L (ref 96–112)
CO2: 22 mEq/L (ref 19–32)
CREATININE: 1.11 mg/dL — AB (ref 0.50–1.10)
GFR calc non Af Amer: 62 mL/min — ABNORMAL LOW (ref >90)
GFR, EST AFRICAN AMERICAN: 72 mL/min — AB (ref >90)
Glucose, Bld: 110 mg/dL — ABNORMAL HIGH (ref 70–99)
Potassium: 4.2 mEq/L (ref 3.7–5.3)
Sodium: 140 mEq/L (ref 137–147)

## 2014-05-10 LAB — CBC
HCT: 30.1 % — ABNORMAL LOW (ref 36.0–46.0)
Hemoglobin: 10.3 g/dL — ABNORMAL LOW (ref 12.0–15.0)
MCH: 27.5 pg (ref 26.0–34.0)
MCHC: 34.2 g/dL (ref 30.0–36.0)
MCV: 80.3 fL (ref 78.0–100.0)
PLATELETS: 235 10*3/uL (ref 150–400)
RBC: 3.75 MIL/uL — ABNORMAL LOW (ref 3.87–5.11)
RDW: 13.9 % (ref 11.5–15.5)
WBC: 7.4 10*3/uL (ref 4.0–10.5)

## 2014-05-10 LAB — TROPONIN I: Troponin I: <0.3 ng/mL (ref <0.30)

## 2014-05-10 NOTE — ED Provider Notes (Signed)
CSN: [id number]     Arrival date & time 05/10/14  2202 History   First MD Initiated Contact with Patient 05/10/14 2321     Chief Complaint  Patient presents with  . Chest Pain     (Consider location/radiation/quality/duration/timing/severity/associated sxs/prior Treatment) HPI  Sabrina Bond is a 38 y.o. female with past medical history of hypertension coming in with chest pain. Patient states this occurred to her previously where she was found to have a stroke. Her MRI was normal and she was placed on metoprolol. She also has a history of anxiety. She's been taking her medication once a day, but has been prescribed for twice a day. This evening around 7 PM she began experiencing chest pressure, and feelings of heartburn. This occurred at rest while watching TV. She denies shortness of breath emesis or diaphoresis. She then draped a soda and took her blood pressure and it was 180 systolic. Patient states her symptoms spontaneously resolved in the ambulance. She denies any exertional component to this.  10 Systems reviewed and are negative for acute change except as noted in the HPI.    Past Medical History  Diagnosis Date  . Hypertension    No past surgical history on file. Family History  Problem Relation Age of Onset  . Hypertension Mother   . Hypertension Father    History  Substance Use Topics  . Smoking status: Never Smoker   . Smokeless tobacco: Not on file  . Alcohol Use: No   OB History   Grav Para Term Preterm Abortions TAB SAB Ect Mult Living                 Review of Systems    Allergies  Kiwi extract; Benicar; Peanut-containing drug products; and Naproxen  Home Medications   Prior to Admission medications   Medication Sig Start Date End Date Taking? Authorizing Provider  metoprolol (LOPRESSOR) 50 MG tablet Take 50 mg by mouth daily.   Yes Historical Provider, MD   BP 146/74  Pulse 70  Temp(Src) 98.5 F (36.9 C)  Resp 20  Ht  (1.626 m)  Wt 243 lb  (110.224 kg)  BMI 41.69 kg/m2  SpO2 100% Physical Exam  Nursing note and vitals reviewed. Constitutional: She is oriented to person, place, and time. She appears well-developed and well-nourished. No distress.  HENT:  Head: Normocephalic and atraumatic.  Nose: Nose normal.  Mouth/Throat: Oropharynx is clear and moist. No oropharyngeal exudate.  Eyes: Conjunctivae and EOM are normal. Pupils are equal, round, and reactive to light. No scleral icterus.  Neck: Normal range of motion. Neck supple. No JVD present. No tracheal deviation present. No thyromegaly present.  Cardiovascular: Normal rate, regular rhythm and normal heart sounds.  Exam reveals no gallop and no friction rub.   No murmur heard. Pulmonary/Chest: Effort normal and breath sounds normal. No respiratory distress. She has no wheezes. She exhibits no tenderness.  Abdominal: Soft. Bowel sounds are normal. She exhibits no distension and no mass. There is no tenderness. There is no rebound and no guarding.  Musculoskeletal: Normal range of motion. She exhibits no edema and no tenderness.  Lymphadenopathy:    She has no cervical adenopathy.  Neurological: She is alert and oriented to person, place, and time.  Skin: Skin is warm and dry. No rash noted. She is not diaphoretic. No erythema. No pallor.    ED Course  Procedures (including critical care time) Labs Review Labs Reviewed  CBC - Abnormal; Notable for the  following:    RBC 3.75 (*)    Hemoglobin 10.3 (*)    HCT 30.1 (*)    All other components within normal limits  BASIC METABOLIC PANEL - Abnormal; Notable for the following:    Glucose, Bld 110 (*)    Creatinine, Ser 1.11 (*)    GFR calc non Af Amer 62 (*)    GFR calc Af Amer 72 (*)    All other components within normal limits  TROPONIN I  PREGNANCY, URINE    Imaging Review Dg Chest 2 View  05/11/2014   CLINICAL DATA:  Hypertension.  Headache.  EXAM: CHEST  2 VIEW  COMPARISON:  None.  FINDINGS: The lungs are  well-aerated and clear. There is no evidence of focal opacification, pleural effusion or pneumothorax.  The heart is normal in size; the mediastinal contour is within normal limits. No acute osseous abnormalities are seen.  IMPRESSION: No acute cardiopulmonary process seen.   Electronically Signed   By: Roanna Raider M.D.   On: 05/11/2014 00:59     EKG Interpretation   Date/Time:  Monday May 10 2014 22:31:52 EDT Ventricular Rate:  62 PR Interval:  170 QRS Duration: 70 QT Interval:  426 QTC Calculation: 432 R Axis:   34 Text Interpretation:  Normal sinus rhythm Normal ECG No significant change  since last tracing Confirmed by Erroll Luna [phone]) on 05/10/2014  11:58:48 PM      MDM   Final diagnoses:  None    Patient is to emergency department out of concern for chest pain. I have low concern for an acute coronary event as this pain was nonexertional, and has no associated symptoms. Patient was not taking her Toprol medication as prescribed. In addition she drinks heavy amounts of caffeine every day including coffee and sodas. She was unaware this had an effect on her heart rate and blood pressure. Patient was educated on this and other diet changes to help maintain her blood pressure. Troponin was sent by triage, it was not indicated in her case. EKG is normal and unchanged from previous.  HEART score is 1.  Will obtain chest x-ray and reevaluate.  Chest x-ray was negative. Patient remains asymptomatic. Her vital signs remain within normal limits and she is safe for discharge.   Tomasita Crumble, MD 05/11/14 0730

## 2014-05-10 NOTE — ED Notes (Signed)
Per EMS: pt coming from home with c/o intermitting chest pressure since last night. Pt is now currently pain free. Denies any associated symptoms. Pt was recently put on new blood pressure medication and nervous about taking medication. Pt only took half prescribed dose.

## 2014-05-11 ENCOUNTER — Emergency Department (HOSPITAL_COMMUNITY): Payer: Medicaid Other

## 2014-05-11 LAB — PREGNANCY, URINE: Preg Test, Ur: NEGATIVE

## 2014-05-11 NOTE — ED Notes (Signed)
Importance of compliance with metoprolol went over with patient.

## 2014-05-11 NOTE — Discharge Instructions (Signed)
Chest Pain (Nonspecific) Sabrina Bond, you were seen today for chest pain.  Please take your metoprolol as prescribed and stay away from caffeine.  Follow up with a primary physician within 3 days for continued care.  If your symptoms worsen, return to the ED for repeat evaluation.  Thank you. It is often hard to give a diagnosis for the cause of chest pain. There is always a chance that your pain could be related to something serious, such as a heart attack or a blood clot in the lungs. You need to follow up with your doctor. HOME CARE  If antibiotic medicine was given, take it as directed by your doctor. Finish the medicine even if you start to feel better.  For the next few days, avoid activities that bring on chest pain. Continue physical activities as told by your doctor.  Do not use any tobacco products. This includes cigarettes, chewing tobacco, and e-cigarettes.  Avoid drinking alcohol.  Only take medicine as told by your doctor.  Follow your doctor's suggestions for more testing if your chest pain does not go away.  Keep all doctor visits you made. GET HELP IF:  Your chest pain does not go away, even after treatment.  You have a rash with blisters on your chest.  You have a fever. GET HELP RIGHT AWAY IF:   You have more pain or pain that spreads to your arm, neck, jaw, back, or belly (abdomen).  You have shortness of breath.  You cough more than usual or cough up blood.  You have very bad back or belly pain.  You feel sick to your stomach (nauseous) or throw up (vomit).  You have very bad weakness.  You pass out (faint).  You have chills. This is an emergency. Do not wait to see if the problems will go away. Call your local emergency services (911 in U.S[place] Do not drive yourself to the hospital. MAKE SURE YOU:   Understand these instructions.  Will watch your condition.  Will get help right away if you are not doing well or get worse. Document Released:  01/30/2008 Document Revised: 08/18/2013 Document Reviewed: 01/30/2008 [facility] Patient Information 2015 [place], [place]. This information is not intended to replace advice given to you by your health care provider. Make sure you discuss any questions you have with your health care provider.   Emergency Department Resource Guide 1) Find a Doctor and Pay Out of Pocket Although you won't have to find out who is covered by your insurance plan, it is a good idea to ask around and get recommendations. You will then need to call the office and see if the doctor you have chosen will accept you as a new patient and what types of options they offer for patients who are self-pay. Some doctors offer discounts or will set up payment plans for their patients who do not have insurance, but you will need to ask so you aren't surprised when you get to your appointment.  2) Contact Your Local Health Department Not all health departments have doctors that can see patients for sick visits, but many do, so it is worth a call to see if yours does. If you don't know where your local health department is, you can check in your phone book. The CDC also has a tool to help you locate your state's health department, and many state websites also have listings of all of their local health departments.  3) Find a [employer] If your  illness is not likely to be very severe or complicated, you may want to try a walk in clinic. These are popping up all over the country in pharmacies, drugstores, and shopping centers. They're usually staffed by nurse practitioners or physician assistants that have been trained to treat common illnesses and complaints. They're usually fairly quick and inexpensive. However, if you have serious medical issues or chronic medical problems, these are probably not your best option.  No Primary Care Doctor: - Call Health Connect at  [phone] - they can help you locate a primary care doctor that  accepts  your insurance, provides certain services, etc. - Physician Referral Service- [phone]  Chronic Pain Problems: Organization         Address  Phone   Notes  [facility]  [phone] Patients need to be referred by their primary care doctor.   Medication Assistance: Organization         Address  Phone   Notes  [facility] Medication [facility] [address] [place] [place], [place] [address] [phone] --Must be a resident of [facility] -- Must have NO insurance coverage whatsoever (no Medicaid/ Medicare, etc.) -- The pt. MUST have a primary care doctor that directs their care regularly and follows them in the community   Med[facility]ist  [phone]   [employer]  [phone]    Agencies that provide inexpensive medical care: Organization         Address  Phone   Notes  [facility]  [phone]   [facility]    [phone]   [facility] [address] [place], [place] [address] [phone]   [facility] [address] [place], [place] [phone]   Planned Parenthood    [phone]   [facility]    [phone]   [facility] and [facility]  [address], [place] Phone:  [phone], Fax:  [phone] Hours of Operation:  9 am - 6 pm, M-F.  Also accepts Medicaid/Medicare and self-pay.  [facility]  [address], [place] Phone: [phone], Fax: [phone]. Hours of Operation:  8:30 am - 5:30 pm, M-F.  Also accepts Medicaid and self-pay.  [facility] [place] [address] [place] Phone: [phone]   [facility] [address] Natasha Bence [place], [place] [phone], Ext. [phone] Mondays & Thursdays: 7-9 AM.  First 15 patients are seen on a first come, first serve basis.    Medicaid-accepting [facility] Providers:  Organization          Address  Phone   Notes  [facility] [address], [place] [phone] Also accepts self-pay patients.  [facility] [address] Laurell Josephs [place], [place]  [phone]   [facility] [address], [place] [phone]   [facility] [address] [place] [phone]   Renaye Rakers [address], [place]   [phone] Only accepts [place] Access [place] patients after they have their name applied to their card.   Self-Pay (no insurance) in [facility]:  Organization         Address  Phone   Notes  Sickle Cell Patients, [facility] [address] [place], [place] [phone]  [facility] Care [address] [place], [place] [phone]   [facility]  [address], [place] [phone]   [facility]/Dr. Osei-Bonsu  [address], [place] or [address], [place] [phone] Phone number for both [place] and [place] locations is the same.  [facility]gent Medical and [facility] [address] [place] [address] [place] [phone]   [facility] [address] [place] or [address] [phone]   [facility] [address] [place] [phone], phone; [phone], fax Sees patients 1st and 3rd Saturday of every month.  Must not qualify for public or private insurance (i.e. Medicaid, Medicare, [place] Health Choice, Veterans' Benefits)  Household income should be no more than 200% of the poverty level The clinic cannot treat you if you are pregnant or think you are pregnant  Sexually transmitted diseases are not treated at the clinic.    Dental Care: Organization         Address  Phone  Notes  [facility] of [facility] [address] [place],  [place] [phone] Accepts children up to age 70 who are enrolled in [place] or [place] Health Choice; pregnant women with a Medicaid card; and children who have applied for Medicaid or [place] Health Choice, but were declined, whose parents can pay a reduced fee at time of service.  [facility] Department of [facility]  [address], [place] [phone] Accepts children up to age 79 who are enrolled in [place] or [place] Health Choice; pregnant women with a Medicaid card; and children who have applied for Medicaid or [place] Health Choice, but were declined, whose parents can pay a reduced fee at time of service.  [facility]  [address] [place], [place] [phone] Patients are seen by appointment only. Walk-ins are not accepted. [facility] will see patients 63 years of age and older. Monday - Tuesday (8am-5pm) Most Wednesdays (8:30-5pm) $30 per visit, cash only  [facility] Adult Dental Access PROGRAM  [address], [facility] [phone] Patients are seen by appointment only. Walk-ins are not accepted. [facility] will see patients 108 years of age and older. One Wednesday Evening (Monthly: Volunteer Based).  $30 per visit, cash only  [employer]  [phone] for adults; Children under age 4, call Graduate Pediatric Dentistry at [phone]. Children aged 65-14, please call [phone] to request a pediatric application.  Dental services are provided in all areas of dental care including fillings, crowns and bridges, complete and partial dentures, implants, gum treatment, root canals, and extractions. Preventive care is also provided. Treatment is provided to both adults and children. Patients are selected via a lottery and there is often a waiting list.   [facility] [address] [place]  [phone] [email or web address]   [facility] [address] [place], [place]  [phone], Ext. [phone] Second and Fourth Thursday of each month, opens at 6:30 AM; Clinic ends at 9 AM.  Patients are seen on a first-come first-served basis, and a limited number are seen during each clinic.   [facility]  [address] Ether Griffins [place], [place] [phone]   Eligibility Requirements You must have lived in [place], [place], or [place] counties for at least the  last three months.   You cannot be eligible for state or federal sponsored [place], including [employer], [place], or [employer].   You generally cannot be eligible for healthcare insurance through your employer.    How to apply: Eligibility screenings are held every Tuesday and Wednesday afternoon from 1:00 pm until 4:00 pm. You do not need an appointment for the interview!  [facility] [address] [place], [place] [phone]   [facility] Health Department  [phone]   [facility] Health Department  [phone]   [facility] Health Department  [phone]    Behavioral Health Resources in the Community: Intensive Outpatient Programs Organization         Address  Phone  Notes  [facility] Services [address] [place], [place] [phone]   [facility] Outpatient [address] [place], [place] [phone]   [facility] [address] [place], [place]  [phone]   [facility] Mental Health [address],  [place], [place] [phone] or [phone]   Substance Abuse Resources Organization         Address  Phone  Notes  [facility] Drug Services  [phone]   [facility]  [phone]   [facility] [place]  [phone]   Floydene Flock  [phone]   Residential & Outpatient Substance Abuse Program  [phone]   Psychological Services Organization         Address  Phone  Notes  [facility]  [phone]   [facility]  [phone]    [facility] Health [address] [place] [phone] or [phone]    Mobile Crisis Teams Organization         Address  Phone  Notes  [facility], Mobile Crisis Care Unit  [phone]   [facility]  [address] [place], [place] [phone]   Doristine Locks [address] [place] [phone]    Self-Help/Support Groups Organization         Address  Phone             Notes  [facility] [place] - variety of support groups  [phone] Call for more information  Narcotics Anonymous (NA), [facility] [address], [employer] [place]  2 meetings at this location   Statistician         Address  Phone  Notes  [facility] [address],    [place]  [phone]   [facility]  [address], [place] [address], [place], [place] [phone]   [facility] [address] [place], [place] [phone] Admissions: 8am-3pm M-F  [facility] [address],    [place], [place] [phone]   [facility] [address] [place], [place], [place] [phone]   [facility] [address],  [place], [place] [phone]   [facility] - Intensive Outpatient [address] Laurell Josephs [address], [place], [place] [phone]   [facility] (Addiction [facility] [address] [place]  [place], [place] [phone] or [phone]   [facility] ([facility]) [address], [place], [place] [phone] Accepts Medicaid  [facility] [address]  [place] [phone] Substance Abuse/Addiction Treatment   [facility] Organization         Address  Phone  Notes  [facility]  [phone]   Raynelle Fanning  Alvan Dame, PhD [address] Ervin Knack [place], [place]   [phone] or [phone]   [facility] Behavioral   [address] [place], [place] [phone]   [facility] [address] [place], [place] [phone] Insurance/Medicaid/sponsorship through [facility] and Families [address]                                    [place], [place] [phone] Therapy/tele-psych/case  [facility] [address], [place] [phone]    Dr. Lolly Mustache  [phone]   [facility]  [employer] [facility] Dept. 1) [address] [place] 2) [address], [place] 3)  [address], [place] [phone]  [phone]   [facility] Child Abuse Hotline 682 102 5121 or [phone] (After Hours)      Managing Your High Blood Pressure Blood pressure is a measurement of how forceful your blood is pressing against the walls of the arteries. Arteries are muscular tubes within the circulatory system. Blood pressure does not stay the same. Blood pressure rises when you are active, excited, or nervous; and it lowers during sleep and relaxation. If the numbers measuring your blood pressure stay above normal most of the time, you are at risk for health problems. High blood pressure (hypertension) is a long-term (chronic) condition in which blood pressure is elevated. A blood pressure reading is recorded as two numbers, such as 120 over 80 (or 120/80). The first, higher number is called the systolic pressure. It is a measure of the pressure in your arteries as the heart beats. The second, lower number is called the diastolic pressure. It is a measure of the pressure in your arteries as the heart relaxes between beats.  Keeping your blood pressure in a normal range is important to your overall health and prevention of health problems, such as heart disease and stroke. When your blood pressure is uncontrolled, your heart has to work harder than normal. High blood pressure is a very common condition in adults because blood pressure  tends to rise with age. Men and women are equally likely to have hypertension but at different times in life. Before age 40, men are more likely to have hypertension. After 38 years of age, women are more likely to have it. Hypertension is especially common in African Americans. This condition often has no signs or symptoms. The cause of the condition is usually not known. Your caregiver can help you come up with a plan to keep your blood pressure in a normal, healthy range. BLOOD PRESSURE STAGES Blood pressure is classified into four stages: normal, prehypertension, stage 1, and stage 2. Your blood pressure reading will be used to determine what type of treatment, if any, is necessary. Appropriate treatment options are tied to these four stages:  Normal  Systolic pressure (mm Hg): below 120.  Diastolic pressure (mm Hg): below 80. Prehypertension  Systolic pressure (mm Hg): 120 to 139.  Diastolic pressure (mm Hg): 80 to 89. Stage1  Systolic pressure (mm Hg): 140 to 159.  Diastolic pressure (mm Hg): 90 to 99. Stage2  Systolic pressure (mm Hg): 160 or above.  Diastolic pressure (mm Hg): 100 or above. RISKS RELATED TO HIGH BLOOD PRESSURE Managing your blood pressure is an important responsibility. Uncontrolled high blood pressure can lead to:  A heart attack.  A stroke.  A weakened blood vessel (aneurysm).  Heart failure.  Kidney damage.  Eye damage.  Metabolic syndrome.  Memory and concentration problems. HOW TO MANAGE YOUR BLOOD PRESSURE Blood pressure can be managed effectively with lifestyle changes and medicines (if needed). Your caregiver will help you come up with a plan to bring your blood pressure within a normal range. Your plan should include the following: Education  Read all information provided by your caregivers about how to control blood pressure.  Educate yourself on the latest guidelines and treatment recommendations. New research is always being done  to further define the risks and treatments for high blood pressure. Lifestylechanges  Control your weight.  Avoid smoking.  Stay physically active.  Reduce the amount of salt in your diet.  Reduce stress.  Control any chronic conditions, such as high cholesterol or diabetes.  Reduce your alcohol intake. Medicines  Several medicines (antihypertensive medicines) are available, if needed, to bring blood pressure within a normal range. Communication  Review all the medicines you take with your caregiver because there may be side effects or interactions.  Talk with your caregiver about your diet, exercise habits, and other lifestyle factors that may be contributing to high blood pressure.  See your caregiver regularly. Your caregiver can help you create and adjust your plan for managing high blood pressure. RECOMMENDATIONS FOR TREATMENT AND FOLLOW-UP  The following recommendations are based on current guidelines for managing high blood pressure in nonpregnant adults. Use these recommendations to identify the proper follow-up period or treatment option based on your blood pressure reading. You can discuss these options with your caregiver.  Systolic pressure of 120 to 139 or diastolic pressure of 80 to 89: Follow up with your caregiver as directed.  Systolic pressure of 140 to 160 or diastolic pressure of 90 to 100: Follow up with your caregiver within 2 months.  Systolic pressure above 160 or diastolic pressure above 100: Follow up with your caregiver within 1 month.  Systolic pressure above 180 or diastolic pressure above 110: Consider antihypertensive therapy; follow up with your caregiver within 1 week.  Systolic pressure above 200 or diastolic pressure above 120: Begin antihypertensive therapy; follow up with your caregiver within 1 week. Document Released: 05/07/2012 Document Reviewed: 05/07/2012 [facility] Patient Information 2015 [place], [place]. This information is not  intended to replace advice given to you by your health care provider. Make sure you discuss any questions you have with your health care provider.

## 2014-08-03 ENCOUNTER — Other Ambulatory Visit: Payer: Self-pay

## 2014-08-03 DIAGNOSIS — Z1231 Encounter for screening mammogram for malignant neoplasm of breast: Secondary | ICD-10-CM

## 2014-08-17 ENCOUNTER — Ambulatory Visit
Admission: RE | Admit: 2014-08-17 | Discharge: 2014-08-17 | Disposition: A | Discharge status: Home / Self Care | Payer: Medicaid Other | Source: Ambulatory Visit

## 2014-08-17 DIAGNOSIS — Z1231 Encounter for screening mammogram for malignant neoplasm of breast: Secondary | ICD-10-CM

## 2014-09-16 ENCOUNTER — Other Ambulatory Visit: Payer: Self-pay | Admitting: Physician Assistant

## 2014-09-16 DIAGNOSIS — M25561 Pain in right knee: Secondary | ICD-10-CM

## 2015-04-29 ENCOUNTER — Emergency Department (HOSPITAL_COMMUNITY)
Admission: EM | Admit: 2015-04-29 | Discharge: 2015-04-29 | Disposition: A | Discharge status: Home / Self Care | Payer: Medicaid Other | Attending: Emergency Medicine | Admitting: Emergency Medicine

## 2015-04-29 ENCOUNTER — Encounter (HOSPITAL_COMMUNITY): Payer: Self-pay | Admitting: *Deleted

## 2015-04-29 DIAGNOSIS — Z79899 Other long term (current) drug therapy: Secondary | ICD-10-CM | POA: Diagnosis not present

## 2015-04-29 DIAGNOSIS — H538 Other visual disturbances: Secondary | ICD-10-CM | POA: Diagnosis not present

## 2015-04-29 DIAGNOSIS — R51 Headache: Secondary | ICD-10-CM | POA: Insufficient documentation

## 2015-04-29 DIAGNOSIS — R110 Nausea: Secondary | ICD-10-CM | POA: Diagnosis not present

## 2015-04-29 DIAGNOSIS — I10 Essential (primary) hypertension: Secondary | ICD-10-CM | POA: Insufficient documentation

## 2015-04-29 DIAGNOSIS — R6883 Chills (without fever): Secondary | ICD-10-CM | POA: Insufficient documentation

## 2015-04-29 DIAGNOSIS — R519 Headache, unspecified: Secondary | ICD-10-CM

## 2015-04-29 MED ORDER — DIPHENHYDRAMINE HCL 50 MG/ML IJ SOLN
25.0000 mg | Freq: Once | INTRAMUSCULAR | Status: AC
  Administered 2015-04-29: 25 mg via INTRAVENOUS
  Filled 2015-04-29: qty 1

## 2015-04-29 MED ORDER — SODIUM CHLORIDE 0.9 % IV BOLUS (SEPSIS)
1000.0000 mL | Freq: Once | INTRAVENOUS | Status: AC
  Administered 2015-04-29: 1000 mL via INTRAVENOUS

## 2015-04-29 MED ORDER — PROCHLORPERAZINE EDISYLATE 5 MG/ML IJ SOLN
10.0000 mg | Freq: Once | INTRAMUSCULAR | Status: AC
  Administered 2015-04-29: 10 mg via INTRAVENOUS
  Filled 2015-04-29: qty 2

## 2015-04-29 MED ORDER — BUTALBITAL-APAP-CAFFEINE 50-325-40 MG PO TABS: 1.0000 | ORAL_TABLET | Freq: Four times a day (QID) | ORAL | Status: AC | PRN

## 2015-04-29 MED ORDER — KETOROLAC TROMETHAMINE 30 MG/ML IJ SOLN
30.0000 mg | Freq: Once | INTRAMUSCULAR | Status: AC
  Administered 2015-04-29: 30 mg via INTRAVENOUS
  Filled 2015-04-29: qty 1

## 2015-04-29 NOTE — ED Provider Notes (Signed)
CSN: [id number]     Arrival date & time 04/29/15  1858 History   First MD Initiated Contact with Patient 04/29/15 1944     Chief Complaint  Patient presents with  . Headache     (Consider location/radiation/quality/duration/timing/severity/associated sxs/prior Treatment) HPI Caleah Tortorelli is a 39 yo female who presents today with a headache x 5 days. She states her headache started on Monday while at work. It comes and goes. She has tried Excedrin migraine which provided no relief. She states she has been stressed at work this past week. She has been nauseous but has not vomited. She denies sensitivity to light and sound. She has had chills and blurry vision with her headaches but is still able to work. She denies fever, night sweats, abdominal pain.   Past Medical History  Diagnosis Date  . Hypertension    History reviewed. No pertinent past surgical history. Family History  Problem Relation Age of Onset  . Hypertension Mother   . Hypertension Father    Social History  Substance Use Topics  . Smoking status: Never Smoker   . Smokeless tobacco: None  . Alcohol Use: No   OB History    No data available     Review of Systems  All other systems negative except as documented in the HPI. All pertinent positives and negatives as reviewed in the HPI.  Allergies  Kiwi extract; Benicar; Peanut-containing drug products; and Naproxen  Home Medications   Prior to Admission medications   Medication Sig Start Date End Date Taking? Authorizing Provider  metoprolol (LOPRESSOR) 50 MG tablet Take 50 mg by mouth daily.    Historical Provider, MD   BP 150/58 mmHg  Pulse 68  Temp(Src) 98.5 F (36.9 C)  Resp 16  Ht  (1.626 m)  Wt 240 lb 4 oz (108.977 kg)  BMI 41.22 kg/m2  SpO2 100%  LMP 04/29/2015 Physical Exam  Constitutional: She is oriented to person, place, and time. She appears well-developed and well-nourished. No distress.  HENT:  Head: Normocephalic and atraumatic.   Mouth/Throat: Oropharynx is clear and moist.  Eyes: EOM are normal. Pupils are equal, round, and reactive to light.  Neck: Normal range of motion. Neck supple.  Cardiovascular: Normal rate, regular rhythm and normal heart sounds.  Exam reveals no gallop and no friction rub.   No murmur heard. Pulmonary/Chest: Effort normal and breath sounds normal. No respiratory distress.  Abdominal: Soft. Bowel sounds are normal. She exhibits no distension. There is no tenderness.  Neurological: She is alert and oriented to person, place, and time. She exhibits normal muscle tone. Coordination normal.  Skin: Skin is warm and dry. No erythema.  Psychiatric: She has a normal mood and affect. Her behavior is normal.  Nursing note and vitals reviewed.   ED Course  Procedures (including critical care time) I have personally reviewed and evaluated these images and lab results as part of my medical decision-making.   Patient is feeling better at this time.  She states that she has no headache.  Patient was given IV fluids.  She still to return here for any worsening in her condition.  Patient agrees the plan and all questions were answered   Charlestine Night, PA-C 05/09/15 1637  Bethann Berkshire, MD 05/11/15 [phone]

## 2015-04-29 NOTE — ED Notes (Signed)
Patient left at this time with all belongings. 

## 2015-04-29 NOTE — Discharge Instructions (Signed)
Return here as needed.  Follow-up with your doctor as needed. °

## 2015-04-29 NOTE — ED Notes (Signed)
PA student in room with patient at this time

## 2015-04-29 NOTE — ED Notes (Signed)
The pt has had a headache for 5 days with n v   No hx of headaches.. lmp  due

## 2015-05-01 ENCOUNTER — Emergency Department (HOSPITAL_COMMUNITY): Payer: Medicaid Other

## 2015-05-01 ENCOUNTER — Encounter (HOSPITAL_COMMUNITY): Payer: Self-pay | Admitting: *Deleted

## 2015-05-01 ENCOUNTER — Emergency Department (HOSPITAL_COMMUNITY)
Admission: EM | Admit: 2015-05-01 | Discharge: 2015-05-01 | Disposition: A | Discharge status: Home / Self Care | Payer: Medicaid Other | Attending: Emergency Medicine | Admitting: Emergency Medicine

## 2015-05-01 DIAGNOSIS — I10 Essential (primary) hypertension: Secondary | ICD-10-CM | POA: Insufficient documentation

## 2015-05-01 DIAGNOSIS — R51 Headache: Secondary | ICD-10-CM | POA: Insufficient documentation

## 2015-05-01 DIAGNOSIS — J3489 Other specified disorders of nose and nasal sinuses: Secondary | ICD-10-CM | POA: Insufficient documentation

## 2015-05-01 DIAGNOSIS — R519 Headache, unspecified: Secondary | ICD-10-CM

## 2015-05-01 DIAGNOSIS — Z79899 Other long term (current) drug therapy: Secondary | ICD-10-CM | POA: Insufficient documentation

## 2015-05-01 DIAGNOSIS — E663 Overweight: Secondary | ICD-10-CM | POA: Insufficient documentation

## 2015-05-01 DIAGNOSIS — R0981 Nasal congestion: Secondary | ICD-10-CM

## 2015-05-01 MED ORDER — OXYMETAZOLINE HCL 0.05 % NA SOLN
2.0000 | Freq: Once | NASAL | Status: AC
  Administered 2015-05-01: 2 via NASAL
  Filled 2015-05-01: qty 15

## 2015-05-01 MED ORDER — FLUTICASONE PROPIONATE 50 MCG/ACT NA SUSP: 1.0000 | Freq: Every day | NASAL | Status: DC

## 2015-05-01 NOTE — ED Notes (Signed)
Pt given ice pack

## 2015-05-01 NOTE — ED Notes (Signed)
Pt verbalized understanding d/c instructions. 

## 2015-05-01 NOTE — ED Notes (Addendum)
Brought in by EMS for a headache that has lasted x 2 weeks. Photophobia. Nothing makes it better. Made worse by standing. [facility] same, was unable to afford to fill the Rx she was given.   Pain started when she took a muscle relaxer.

## 2015-05-01 NOTE — ED Provider Notes (Signed)
CSN: [id number]     Arrival date & time 05/01/15  1003 History   First MD Initiated Contact with Patient 05/01/15 1006     Chief Complaint  Patient presents with  . Headache     (Consider location/radiation/quality/duration/timing/severity/associated sxs/prior Treatment) HPI  Sabrina Bond is a 39 y.o. female presents for evaluation of headache present for 2 weeks, worsening in the last 5 days. Seen and treated for same, 2 days ago, at [facility] emergency department, states she did not get relief with initial treatment and has not been able to afford the prescription that was given. Her pain is in her bilateral forehead and bilateral cheek region. She denies sinus congestion, ear pain, sore throat, neck pain, back pain, fever, chills, nausea or vomiting. She causes headache "a migraine" but has never been diagnosed with migraine headaches. She does have chronic intermittent sinus disease. There are no other known modifying factors.   Past Medical History  Diagnosis Date  . Hypertension    History reviewed. No pertinent past surgical history. Family History  Problem Relation Age of Onset  . Hypertension Mother   . Hypertension Father    Social History  Substance Use Topics  . Smoking status: Never Smoker   . Smokeless tobacco: None  . Alcohol Use: No   OB History    No data available     Review of Systems  All other systems reviewed and are negative.     Allergies  Kiwi extract; Metoprolol; Benicar; Peanut-containing drug products; and Naproxen  Home Medications   Prior to Admission medications   Medication Sig Start Date End Date Taking? Authorizing Provider  aspirin-acetaminophen-caffeine (EXCEDRIN MIGRAINE) [phone] MG per tablet Take 2 tablets by mouth every 8 (eight) hours as needed for headache.   Yes Historical Provider, MD  losartan-hydrochlorothiazide (HYZAAR) 100-12.5 MG per tablet Take 1 tablet by mouth daily.   Yes Historical Provider, MD  methocarbamol (ROBAXIN)  500 MG tablet Take 500 mg by mouth at bedtime.    Yes Historical Provider, MD  butalbital-acetaminophen-caffeine (FIORICET) 50-325-40 MG per tablet Take 1 tablet by mouth every 6 (six) hours as needed for headache. Patient not taking: Reported on 05/01/2015 04/29/15 04/28/16  Charlestine Night, PA-C   BP 151/63 mmHg  Pulse 106  Temp(Src) 98.5 F (36.9 C) (Oral)  Resp 18  SpO2 100%  LMP 04/29/2015 Physical Exam  Constitutional: She is oriented to person, place, and time. She appears well-developed. No distress.  She is overweight.  HENT:  Head: Normocephalic and atraumatic.  Right Ear: External ear normal.  Left Ear: External ear normal.  No tenderness to percussion over the maxillary or frontal sinuses.  Eyes: Conjunctivae and EOM are normal. Pupils are equal, round, and reactive to light.  Neck: Normal range of motion and phonation normal. Neck supple.  There is no meningismus.  Cardiovascular: Normal rate, regular rhythm and normal heart sounds.   Pulmonary/Chest: Effort normal and breath sounds normal. She exhibits no bony tenderness.  Abdominal: Soft. There is no tenderness.  Musculoskeletal: Normal range of motion.  Neurological: She is alert and oriented to person, place, and time. No cranial nerve deficit or sensory deficit. She exhibits normal muscle tone. Coordination normal.  Skin: Skin is warm, dry and intact.  Psychiatric: She has a normal mood and affect. Her behavior is normal. Judgment and thought content normal.  Nursing note and vitals reviewed.   ED Course  Procedures (including critical care time)  Medications  oxymetazoline (AFRIN) 0.05 % nasal spray  2 spray (not administered)    Patient Vitals for the past 24 hrs:  BP Temp Temp src Pulse Resp SpO2  05/01/15 1147 151/63 mmHg 98.5 F (36.9 C) Oral 106 18 100 %  05/01/15 1018 144/55 mmHg 98.5 F (36.9 C) Oral 102 18 100 %    At discharge-  Reevaluation with update and discussion. After initial assessment  and treatment, an updated evaluation reveals she feels better. Findings discussed patient, questions were answered.Mancel Bale L    Labs Review Labs Reviewed - No data to display  Imaging Review No results found. I have personally reviewed and evaluated these images and lab results as part of my medical decision-making.   EKG Interpretation None      MDM   Final diagnoses:  Face pain  Sinus congestion    Sinus headache and nasal congestion, likely related to sinus disease. Most likely this represents an allergic sinus process. Doubt acute bacterial sinusitis, metabolic instability or serious bacterial infection.  Nursing Notes Reviewed/ Care Coordinated Applicable Imaging Reviewed Interpretation of Laboratory Data incorporated into ED treatment  The patient appears reasonably screened and/or stabilized for discharge and I doubt any other medical condition or other [facility] requiring further screening, evaluation, or treatment in the ED at this time prior to discharge.  Plan: Home Medications- Flonase, Afrin; Home Treatments- rest; return here if the recommended treatment, does not improve the symptoms; Recommended follow up- PCP 1 week     Mancel Bale, MD 05/01/15 [phone]

## 2015-05-01 NOTE — Discharge Instructions (Signed)
Use the nasal spray, one in each nostril twice a day, for 3 or 4 days. Use the prescription nasal spray, as directed. Consider using Claritin or Zyrtec for several weeks to treat possible allergic symptoms.   Sinus Headache A sinus headache is when your sinuses become clogged or swollen. Sinus headaches can range from mild to severe.  CAUSES A sinus headache can have different causes, such as:  Colds.  Sinus infections.  Allergies. SYMPTOMS  Symptoms of a sinus headache may vary and can include:  Headache.  Pain or pressure in the face.  Congested or runny nose.  Fever.  Inability to smell.  Pain in upper teeth. Weather changes can make symptoms worse. TREATMENT  The treatment of a sinus headache depends on the cause.  Sinus pain caused by a sinus infection may be treated with antibiotic medicine.  Sinus pain caused by allergies may be helped by allergy medicines (antihistamines) and medicated nasal sprays.  Sinus pain caused by congestion may be helped by flushing the nose and sinuses with saline solution. HOME CARE INSTRUCTIONS   If antibiotics are prescribed, take them as directed. Finish them even if you start to feel better.  Only take over-the-counter or prescription medicines for pain, discomfort, or fever as directed by your caregiver.  If you have congestion, use a nasal spray to help reduce pressure. SEEK IMMEDIATE MEDICAL CARE IF:  You have a fever.  You have headaches more than once a week.  You have sensitivity to light or sound.  You have repeated nausea and vomiting.  You have vision problems.  You have sudden, severe pain in your face or head.  You have a seizure.  You are confused.  Your sinus headaches do not get better after treatment. Many people think they have a sinus headache when they actually have migraines or tension headaches. MAKE SURE YOU:   Understand these instructions.  Will watch your condition.  Will get help  right away if you are not doing well or get worse. Document Released: 09/20/2004 Document Revised: 11/05/2011 Document Reviewed: 11/11/2010 [facility] Patient Information 2015 [place], [place]. This information is not intended to replace advice given to you by your health care provider. Make sure you discuss any questions you have with your health care provider.

## 2015-05-01 NOTE — ED Notes (Signed)
Bed: ZO10 Expected date:  Expected time:  Means of arrival:  Comments:  Migraine

## 2015-05-01 NOTE — ED Notes (Signed)
Pt states Afrin relieved sinus pressure and improved nasal congestion. Pt continues with frontal headache.

## 2015-05-06 ENCOUNTER — Other Ambulatory Visit: Payer: Self-pay

## 2015-06-01 ENCOUNTER — Other Ambulatory Visit: Payer: Self-pay

## 2015-10-10 ENCOUNTER — Emergency Department (HOSPITAL_COMMUNITY)
Admission: EM | Admit: 2015-10-10 | Discharge: 2015-10-10 | Disposition: A | Discharge status: Home / Self Care | Payer: Medicaid Other | Attending: Emergency Medicine | Admitting: Emergency Medicine

## 2015-10-10 ENCOUNTER — Encounter (HOSPITAL_COMMUNITY): Payer: Self-pay | Admitting: *Deleted

## 2015-10-10 DIAGNOSIS — Z7951 Long term (current) use of inhaled steroids: Secondary | ICD-10-CM | POA: Insufficient documentation

## 2015-10-10 DIAGNOSIS — M1990 Unspecified osteoarthritis, unspecified site: Secondary | ICD-10-CM | POA: Diagnosis not present

## 2015-10-10 DIAGNOSIS — H9201 Otalgia, right ear: Secondary | ICD-10-CM

## 2015-10-10 DIAGNOSIS — I10 Essential (primary) hypertension: Secondary | ICD-10-CM | POA: Insufficient documentation

## 2015-10-10 DIAGNOSIS — Z79899 Other long term (current) drug therapy: Secondary | ICD-10-CM | POA: Diagnosis not present

## 2015-10-10 HISTORY — DX: Unspecified osteoarthritis, unspecified site: M19.90

## 2015-10-10 NOTE — ED Provider Notes (Signed)
CSN: [id number]     Arrival date & time 10/10/15  1046 History  By signing my name below, I, Iona Beard, attest that this documentation has been prepared under the direction and in the presence of Velna Hatchet, PA-C.  Electronically Signed: Iona Beard, ED Scribe  at 12:31 PM.    Chief Complaint  Patient presents with  . Otalgia    The history is provided by the patient. No language interpreter was used.   HPI Comments: Sabrina Bond is a 40 y.o. female who presents to the Emergency Department complaining of gradual onset, constant right ear pain, onset about one week ago. Pt reports the pain is worsened with chewing and that the pain radiates down into her jaw. Pt has taken ibuprofen with no relief of symptoms. No other worsening or alleviating factors noted. Pt denies any other associated symptoms.    Past Medical History  Diagnosis Date  . Hypertension   . Arthritis    Past Surgical History  Procedure Laterality Date  . Endometrial ablation    . Cholecystectomy    . Tubal ligation     Family History  Problem Relation Age of Onset  . Hypertension Mother   . Hypertension Father    Social History  Substance Use Topics  . Smoking status: Never Smoker   . Smokeless tobacco: None  . Alcohol Use: No   OB History    No data available     Review of Systems  HENT: Positive for ear pain. Negative for ear discharge, sore throat and trouble swallowing.   All other systems reviewed and are negative.     Allergies  Kiwi extract; Metoprolol; Benicar; Peanut-containing drug products; and Naproxen  Home Medications   Prior to Admission medications   Medication Sig Start Date End Date Taking? Authorizing Provider  aspirin-acetaminophen-caffeine (EXCEDRIN MIGRAINE) [phone] MG per tablet Take 2 tablets by mouth every 8 (eight) hours as needed for headache.    Historical Provider, MD  butalbital-acetaminophen-caffeine (FIORICET) 50-325-40 MG per tablet Take 1  tablet by mouth every 6 (six) hours as needed for headache. Patient not taking: Reported on 05/01/2015 04/29/15 04/28/16  Charlestine Night, PA-C  fluticasone [facility]) 50 MCG/ACT nasal spray Place 1 spray into both nostrils daily. 05/01/15   Mancel Bale, MD  losartan-hydrochlorothiazide (HYZAAR) 100-12.5 MG per tablet Take 1 tablet by mouth daily.    Historical Provider, MD  methocarbamol (ROBAXIN) 500 MG tablet Take 500 mg by mouth at bedtime.     Historical Provider, MD   BP 147/74 mmHg  Pulse 69  Temp(Src) 98.7 F (37.1 C) (Oral)  Resp 18  SpO2 99%  LMP 09/26/2015 Physical Exam  Constitutional: She appears well-developed and well-nourished. No distress.  HENT:  Head: Normocephalic and atraumatic.  Right Ear: Hearing, tympanic membrane and external ear normal. No drainage, swelling or tenderness. Tympanic membrane is not injected, not scarred, not perforated, not erythematous, not retracted and not bulging. Tympanic membrane mobility is normal. No middle ear effusion.  Left Ear: Hearing, tympanic membrane and external ear normal. No drainage, swelling or tenderness. Tympanic membrane is not injected, not scarred, not perforated, not erythematous, not retracted and not bulging. Tympanic membrane mobility is normal.  No middle ear effusion. No decreased hearing is noted.  Mouth/Throat: Oropharynx is clear and moist and mucous membranes are normal.  Eyes: Conjunctivae and EOM are normal.  Neck: Neck supple. No tracheal deviation present.  Cardiovascular: Normal rate.   Pulmonary/Chest: Effort normal. No respiratory distress.  Musculoskeletal:  Normal range of motion.  Neurological: She is alert.  Skin: Skin is warm and dry.  Psychiatric: She has a normal mood and affect. Her behavior is normal.    ED Course  Procedures (including critical care time) DIAGNOSTIC STUDIES: Oxygen Saturation is 99% on RA, normal by my interpretation.    COORDINATION OF CARE: 12:28 PM-Discussed treatment plan  which includes aleve for pain management and symptom monitoring with pt at bedside and pt agreed to plan.   Labs Review Labs Reviewed - No data to display  Imaging Review No results found.    EKG Interpretation None     Meds given in ED:  Medications - No data to display  Discharge Medication List as of 10/10/2015 12:38 PM     Filed Vitals:   10/10/15 1119 10/10/15 1246  BP: 147/74 150/73  Pulse: 69 78  Temp: 98.7 F (37.1 C)   TempSrc: Oral   Resp: 18 16  SpO2: 99% 99%    MDM  Sabrina Bond is a 40 y.o. female here for evaluation of right ear pain. No evidence of infection on exam. No trismus. No evidence of mastoiditis or parotitis. Oropharynx is clear. Encourage continue use of NSAIDs. Follow-up with PCP in 2-3 days. Discussed return precautions. She verbalizes understanding and agrees with this plan as well as subsequent discharge. Voices no other questions or concerns at this time.  The patient appears reasonably screened and/or stabilized for discharge and I doubt any other medical condition or other [facility] requiring further screening, evaluation, or treatment in the ED at this time prior to discharge.   Final diagnoses:  Otalgia, right   I personally performed the services described in this documentation, which was scribed in my presence. The recorded information has been reviewed and is accurate.     Joycie Peek, PA-C 10/10/15 1441  Arby Barrette, MD 10/12/15 1700

## 2015-10-10 NOTE — ED Notes (Signed)
PT is here with right ear hurting since last week.  Pt states headache to frontal area and resolves with ibuprofen.  Pt states pain in ear continues.  Pt has light headache now.

## 2015-10-10 NOTE — Discharge Instructions (Signed)
There is not appear to be any evidence of infection or other emergent process. You may continue taking Aleve for her discomfort. Do not take this with ibuprofen as they work similarly. Follow-up with your doctor in the next 2-3 days for reevaluation. Return to ED for any new or worsening symptoms as we discussed.

## 2015-10-24 ENCOUNTER — Emergency Department (HOSPITAL_COMMUNITY): Payer: Medicaid Other

## 2015-10-24 ENCOUNTER — Emergency Department (HOSPITAL_COMMUNITY)
Admission: EM | Admit: 2015-10-24 | Discharge: 2015-10-24 | Disposition: A | Discharge status: Home / Self Care | Payer: Medicaid Other | Attending: Emergency Medicine | Admitting: Emergency Medicine

## 2015-10-24 ENCOUNTER — Encounter (HOSPITAL_COMMUNITY): Payer: Self-pay | Admitting: Emergency Medicine

## 2015-10-24 DIAGNOSIS — R0789 Other chest pain: Secondary | ICD-10-CM | POA: Insufficient documentation

## 2015-10-24 DIAGNOSIS — H81399 Other peripheral vertigo, unspecified ear: Secondary | ICD-10-CM

## 2015-10-24 DIAGNOSIS — H81391 Other peripheral vertigo, right ear: Secondary | ICD-10-CM | POA: Insufficient documentation

## 2015-10-24 DIAGNOSIS — I10 Essential (primary) hypertension: Secondary | ICD-10-CM | POA: Diagnosis not present

## 2015-10-24 DIAGNOSIS — H9201 Otalgia, right ear: Secondary | ICD-10-CM | POA: Insufficient documentation

## 2015-10-24 DIAGNOSIS — Z3202 Encounter for pregnancy test, result negative: Secondary | ICD-10-CM | POA: Insufficient documentation

## 2015-10-24 DIAGNOSIS — R220 Localized swelling, mass and lump, head: Secondary | ICD-10-CM | POA: Diagnosis not present

## 2015-10-24 DIAGNOSIS — Z8739 Personal history of other diseases of the musculoskeletal system and connective tissue: Secondary | ICD-10-CM | POA: Diagnosis not present

## 2015-10-24 DIAGNOSIS — Z79899 Other long term (current) drug therapy: Secondary | ICD-10-CM | POA: Insufficient documentation

## 2015-10-24 DIAGNOSIS — R110 Nausea: Secondary | ICD-10-CM | POA: Diagnosis not present

## 2015-10-24 DIAGNOSIS — R42 Dizziness and giddiness: Secondary | ICD-10-CM | POA: Diagnosis present

## 2015-10-24 LAB — COMPREHENSIVE METABOLIC PANEL
ALBUMIN: 3.6 g/dL (ref 3.5–5.0)
ALT: 16 U/L (ref 14–54)
ANION GAP: 12 (ref 5–15)
AST: 18 U/L (ref 15–41)
Alkaline Phosphatase: 64 U/L (ref 38–126)
BUN: 14 mg/dL (ref 6–20)
CO2: 22 mmol/L (ref 22–32)
Calcium: 9.2 mg/dL (ref 8.9–10.3)
Chloride: 106 mmol/L (ref 101–111)
Creatinine, Ser: 1.34 mg/dL — ABNORMAL HIGH (ref 0.44–1.00)
GFR calc Af Amer: 57 mL/min — ABNORMAL LOW (ref >60)
GFR calc non Af Amer: 49 mL/min — ABNORMAL LOW (ref >60)
GLUCOSE: 123 mg/dL — AB (ref 65–99)
POTASSIUM: 3.5 mmol/L (ref 3.5–5.1)
SODIUM: 140 mmol/L (ref 135–145)
TOTAL PROTEIN: 6.9 g/dL (ref 6.5–8.1)
Total Bilirubin: 0.4 mg/dL (ref 0.3–1.2)

## 2015-10-24 LAB — I-STAT TROPONIN, ED: Troponin i, poc: 0 ng/mL (ref 0.00–0.08)

## 2015-10-24 LAB — POC URINE PREG, ED: Preg Test, Ur: NEGATIVE

## 2015-10-24 LAB — CBC
HEMATOCRIT: 31.2 % — AB (ref 36.0–46.0)
HEMOGLOBIN: 10.4 g/dL — AB (ref 12.0–15.0)
MCH: 27.4 pg (ref 26.0–34.0)
MCHC: 33.3 g/dL (ref 30.0–36.0)
MCV: 82.1 fL (ref 78.0–100.0)
Platelets: 262 10*3/uL (ref 150–400)
RBC: 3.8 MIL/uL — AB (ref 3.87–5.11)
RDW: 14.2 % (ref 11.5–15.5)
WBC: 8.7 10*3/uL (ref 4.0–10.5)

## 2015-10-24 MED ORDER — MECLIZINE HCL 25 MG PO TABS
25.0000 mg | ORAL_TABLET | Freq: Three times a day (TID) | ORAL | Status: DC | PRN
Start: 2015-10-24 — End: 2021-04-25

## 2015-10-24 MED ORDER — MECLIZINE HCL 25 MG PO TABS
25.0000 mg | ORAL_TABLET | Freq: Once | ORAL | Status: AC
  Administered 2015-10-24: 25 mg via ORAL
  Filled 2015-10-24: qty 1

## 2015-10-24 NOTE — ED Provider Notes (Signed)
CSN: [id number]     Arrival date & time 10/24/15  1644 History   First MD Initiated Contact with Patient 10/24/15 2051     Chief Complaint  Patient presents with  . Dizziness     (Consider location/radiation/quality/duration/timing/severity/associated sxs/prior Treatment) HPI  40 year old female with dizziness that started this morning when she woke up at 630 AM. Constant since. Feels like she's spinning. No syncope/near syncope. Feels off balanced when walking. Nauseated, no vomiting. No headache, blurry vision or weakness/numbness. Has right "ear" pain that she localized to the face anterior to her ear. Has been present x 1 week. Slightly improving. No ringing in her ear. Today at work when she moved her head quickly and stood up she got so off balance she fell backwards. No injuries. History of HTN and she is concerned because her BP was 170 systolic on arrival.   Also notes she has had 3 weeks (or more) of left chest pain under her breast. This is worse with movement and palpation. Started after moving heavy furniture. No dyspnea.   Past Medical History  Diagnosis Date  . Hypertension   . Arthritis    Past Surgical History  Procedure Laterality Date  . Endometrial ablation    . Cholecystectomy    . Tubal ligation     Family History  Problem Relation Age of Onset  . Hypertension Mother   . Hypertension Father    Social History  Substance Use Topics  . Smoking status: Never Smoker   . Smokeless tobacco: None  . Alcohol Use: No   OB History    No data available     Review of Systems  Constitutional: Negative for fever.  HENT: Positive for facial swelling.   Eyes: Negative for visual disturbance.  Respiratory: Negative for shortness of breath.   Cardiovascular: Positive for chest pain.  Gastrointestinal: Positive for nausea. Negative for vomiting.  Neurological: Positive for dizziness. Negative for weakness and numbness.  All other systems reviewed and are  negative.     Allergies  Kiwi extract; Metoprolol; Benicar; Peanut-containing drug products; and Naproxen  Home Medications   Prior to Admission medications   Medication Sig Start Date End Date Taking? Authorizing Provider  losartan-hydrochlorothiazide (HYZAAR) 100-12.5 MG per tablet Take 1 tablet by mouth daily.   Yes Historical Provider, MD  butalbital-acetaminophen-caffeine (FIORICET) 50-325-40 MG per tablet Take 1 tablet by mouth every 6 (six) hours as needed for headache. Patient not taking: Reported on 05/01/2015 04/29/15 04/28/16  Charlestine Night, PA-C  fluticasone [facility]) 50 MCG/ACT nasal spray Place 1 spray into both nostrils daily. Patient not taking: Reported on 10/24/2015 05/01/15   Mancel Bale, MD   BP 172/85 mmHg  Pulse 69  Temp(Src) 98.6 F (37 C) (Oral)  Resp 22  SpO2 100%  LMP 09/26/2015 Physical Exam  Constitutional: She is oriented to person, place, and time. She appears well-developed and well-nourished.  HENT:  Head: Normocephalic and atraumatic.    Right Ear: Tympanic membrane, external ear and ear canal normal.  Left Ear: Tympanic membrane, external ear and ear canal normal.  Nose: Nose normal.  Eyes: Right eye exhibits no discharge. Left eye exhibits no discharge.  Cardiovascular: Normal rate, regular rhythm and normal heart sounds.   Pulmonary/Chest: Effort normal and breath sounds normal.  Abdominal: Soft. There is no tenderness.  Neurological: She is alert and oriented to person, place, and time.  CN 2-12 grossly intact. 5/5 strength in all 4 extremities. Grossly normal sensation. Normal finger  to nose. Normal gait.  Skin: Skin is warm and dry.  Nursing note and vitals reviewed.   ED Course  Procedures (including critical care time) Labs Review Labs Reviewed  COMPREHENSIVE METABOLIC PANEL - Abnormal; Notable for the following:    Glucose, Bld 123 (*)    Creatinine, Ser 1.34 (*)    GFR calc non Af Amer 49 (*)    GFR calc Af Amer 57 (*)     All other components within normal limits  CBC - Abnormal; Notable for the following:    RBC 3.80 (*)    Hemoglobin 10.4 (*)    HCT 31.2 (*)    All other components within normal limits  I-STAT TROPOININ, ED  POC URINE PREG, ED    Imaging Review Dg Chest 2 View  10/24/2015  CLINICAL DATA:  40 year old female with chest wall pain. EXAM: CHEST  2 VIEW COMPARISON:  Radiograph dated 05/11/2014 FINDINGS: The heart size and mediastinal contours are within normal limits. Both lungs are clear. The visualized skeletal structures are unremarkable. IMPRESSION: No active cardiopulmonary disease. Electronically Signed   By: Elgie Collard M.D.   On: 10/24/2015 22:03   Mr Brain Wo Contrast  10/24/2015  CLINICAL DATA:  RIGHT ear pain and positional dizziness. Pain radiates to jaw. History of hypertension. Assess for stroke. EXAM: MRI HEAD WITHOUT CONTRAST TECHNIQUE: Multiplanar, multiecho pulse sequences of the brain and surrounding structures were obtained without intravenous contrast. COMPARISON:  CT sinuses May 01, 2015 FINDINGS: The ventricles and sulci are normal for patient's age. No abnormal parenchymal signal, mass lesions, mass effect. No reduced diffusion to suggest acute ischemia. No susceptibility artifact to suggest hemorrhage. No abnormal extra-axial fluid collections. No extra-axial masses though, contrast enhanced sequences would be more sensitive. Normal major intracranial vascular flow voids seen at the skull base. Ocular globes and orbital contents are unremarkable though not tailored for evaluation. No abnormal sellar expansion. No suspicious calvarial bone marrow signal. Craniocervical junction maintained. Cerebellar tonsils descend 3 mm below the foramen magnum though, are not pointed in appearance and, do not reach criteria for Chiari 1 malformation. Small LEFT maxillary mucosal retention cyst, no paranasal sinus air-fluid levels. The mastoid air cells are well aerated. IMPRESSION:  Negative MRI head. Electronically Signed   By: Awilda Metro M.D.   On: 10/24/2015 23:10   I have personally reviewed and evaluated these images and lab results as part of my medical decision-making.   EKG Interpretation   Date/Time:  Monday October 24 2015 21:39:48 EST Ventricular Rate:  79 PR Interval:  164 QRS Duration: 81 QT Interval:  433 QTC Calculation: 496 R Axis:   46 Text Interpretation:  Sinus rhythm Left atrial enlargement Borderline  prolonged QT interval no significant change since 2015 Confirmed by  Khaylee Mcevoy  MD, Cloe Sockwell ([id number]) on 10/24/2015 9:56:08 PM      MDM   Final diagnoses:  Peripheral vertigo, unspecified laterality  Chest wall pain    Patient's vertigo is most likely peripheral given a negative MRI. MRI obtained due to her nearly falling from severe dizziness with concomitant hypertension. She is actually nearly asymptomatic currently. Will discharge with Antivert in case there is a recurrence of symptoms. Her right ear is not really painful, it is anterior to this. No significant swelling. Doubt parotid otitis, especially with improving over the last 1 week. Her chest wall pain has been present for quite some time and I have very low suspicion for ACS, PE, or dissection. Discharge home with NSAIDs.  Follow-up with PCP (she has f/u on Mar 9)    Pricilla Loveless, MD 10/24/15 2335

## 2015-10-24 NOTE — ED Notes (Signed)
Pt sts right ear pain and treated for swollen gland; pt sts dizziness worse with position change

## 2016-04-10 ENCOUNTER — Emergency Department (HOSPITAL_COMMUNITY): Payer: Medicaid Other

## 2016-04-10 ENCOUNTER — Emergency Department (HOSPITAL_COMMUNITY)
Admission: EM | Admit: 2016-04-10 | Discharge: 2016-04-10 | Disposition: A | Discharge status: Home / Self Care | Payer: Medicaid Other | Attending: Emergency Medicine | Admitting: Emergency Medicine

## 2016-04-10 ENCOUNTER — Encounter (HOSPITAL_COMMUNITY): Payer: Self-pay | Admitting: Emergency Medicine

## 2016-04-10 DIAGNOSIS — X509XXA Other and unspecified overexertion or strenuous movements or postures, initial encounter: Secondary | ICD-10-CM | POA: Insufficient documentation

## 2016-04-10 DIAGNOSIS — Y93B9 Activity, other involving muscle strengthening exercises: Secondary | ICD-10-CM | POA: Diagnosis not present

## 2016-04-10 DIAGNOSIS — S3992XA Unspecified injury of lower back, initial encounter: Secondary | ICD-10-CM | POA: Diagnosis present

## 2016-04-10 DIAGNOSIS — I10 Essential (primary) hypertension: Secondary | ICD-10-CM | POA: Diagnosis not present

## 2016-04-10 DIAGNOSIS — Y929 Unspecified place or not applicable: Secondary | ICD-10-CM | POA: Insufficient documentation

## 2016-04-10 DIAGNOSIS — Z91010 Allergy to peanuts: Secondary | ICD-10-CM | POA: Insufficient documentation

## 2016-04-10 DIAGNOSIS — Y999 Unspecified external cause status: Secondary | ICD-10-CM | POA: Diagnosis not present

## 2016-04-10 DIAGNOSIS — S39012A Strain of muscle, fascia and tendon of lower back, initial encounter: Secondary | ICD-10-CM | POA: Insufficient documentation

## 2016-04-10 MED ORDER — KETOROLAC TROMETHAMINE 60 MG/2ML IM SOLN
30.0000 mg | Freq: Once | INTRAMUSCULAR | Status: AC
  Administered 2016-04-10: 30 mg via INTRAMUSCULAR
  Filled 2016-04-10: qty 2

## 2016-04-10 MED ORDER — IBUPROFEN 800 MG PO TABS: 800.0000 mg | ORAL_TABLET | Freq: Three times a day (TID) | ORAL | 0 refills | Status: DC

## 2016-04-10 MED ORDER — METHOCARBAMOL 500 MG PO TABS: 500.0000 mg | ORAL_TABLET | Freq: Two times a day (BID) | ORAL | 0 refills | Status: DC

## 2016-04-10 NOTE — ED Triage Notes (Signed)
Pain in low back and muscles on either side of spine after exercising last week- doing leg lifts, pain has not gotten better with OTC meds-- BC powders and Ibuprofen.

## 2016-04-10 NOTE — Discharge Instructions (Signed)
Your x-ray showed no acute abnormalities. As we discussed I suspect you have some strain and soreness in your muscles from exercising. Take the muscle relaxer and ibuprofen as prescribed. Follow up with Dr. Greig RightMurphy's office if your symptoms persist.

## 2016-04-10 NOTE — ED Provider Notes (Signed)
[facility]-EMERGENCY DEPT Provider Note   CSN: [id number] Arrival date & time: 04/10/16  0845  By signing my name below, I, Sonum Patel, attest that this documentation has been prepared under the direction and in the presence of Junious Ragone, [place].  Electronically Signed: Sonum Patel, Neurosurgeoncribe. 04/10/16. 9:22 AM.  History   Chief Complaint Chief Complaint  Patient presents with  . Back Pain    The history is provided by the patient. No language interpreter was used.     HPI Comments: Sabrina Bond is a 40 y.o. female with past medical history of arthritis who presents to the Emergency Department complaining of sudden onset, constant bilateral lower back pain that began 6-7 days ago while completing leg lifts. She describes the pain as a burning sensation with occasional stabbing pains. She rates her pain as 7/10 currently but states it is worse at night. She has tried ibuprofen and BC powders without relief. She states the pain is worse with turning her torso, sitting for long periods of time, and lying down. She denies numbness, weakness, fever, chills. Denies urinary symptoms.  Past Medical History:  Diagnosis Date  . Arthritis   . Hypertension     Patient Active Problem List   Diagnosis Date Noted  . 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 CHOLECYSTECTOMY    . ENDOMETRIAL ABLATION    . TUBAL LIGATION      OB History    No data available       Home Medications    Prior to Admission medications   Medication Sig Start Date End Date Taking? Authorizing Provider  butalbital-acetaminophen-caffeine (FIORICET) 50-325-40 MG per tablet Take 1 tablet by mouth every 6 (six) hours as needed for headache. Patient not taking: Reported on 05/01/2015 04/29/15 04/28/16  Charlestine Nighthristopher Lawyer, PA-C  fluticasone [facility](FLONASE) 50 MCG/ACT nasal spray Place 1 spray into both nostrils daily. Patient not taking: Reported on 10/24/2015 05/01/15   Mancel BaleElliott Wentz, MD    losartan-hydrochlorothiazide [facility](HYZAAR) 100-12.5 MG per tablet Take 1 tablet by mouth daily.    Historical Provider, MD  meclizine (ANTIVERT) 25 MG tablet Take 1 tablet (25 mg total) by mouth 3 (three) times daily as needed for dizziness. 10/24/15   Pricilla LovelessScott Goldston, MD    Family History Family History  Problem Relation Age of Onset  . Hypertension Mother   . Hypertension Father     Social History Social History  Substance Use Topics  . Smoking status: Never Smoker  . Smokeless tobacco: Never Used  . Alcohol use No     Allergies   Kiwi extract; Metoprolol; Benicar [olmesartan]; Peanut-containing drug products; and Naproxen   Review of Systems Review of Systems  10 Systems reviewed and all are negative for acute change except as noted in the HPI.   Physical Exam Updated Vital Signs BP 147/83   Pulse 80   Temp 98.2 F (36.8 C) (Oral)   Resp 16   Ht 5\' 4"  (1.626 m)   Wt 227 lb (103 kg)   LMP 03/27/2016 (Approximate)   SpO2 100%   BMI 38.96 kg/m   Physical Exam  Constitutional: She is oriented to person, place, and time. She appears well-developed and well-nourished.  HENT:  Head: Normocephalic and atraumatic.  Neck: Normal range of motion.  Cardiovascular: Normal rate.   Pulmonary/Chest: Effort normal.  Musculoskeletal: Normal range of motion. She exhibits tenderness.  No C-spine or T-spine tenderness. Midline lumbar spinal tenderness, and bilateral paraspinal tenderness,  with tenderness to superior bilateral butt cheeks. Steady gait   Neurological: She is alert and oriented to person, place, and time. She has normal reflexes. Gait normal.  Skin: Skin is warm and dry.  Psychiatric: She has a normal mood and affect.  Nursing note and vitals reviewed.    ED Treatments / Results  DIAGNOSTIC STUDIES: Oxygen Saturation is 100% on RA, normal by my interpretation.    COORDINATION OF CARE: 9:22 AM Will order imaging and give Toradol injection. Discussed treatment  plan with pt at bedside and pt agreed to plan.    Labs (all labs ordered are listed, but only abnormal results are displayed) Labs Reviewed - No data to display  EKG  EKG Interpretation None       Radiology Dg Lumbar Spine Complete  Result Date: 04/10/2016 CLINICAL DATA:  Lumbar spine pain, right side pain EXAM: LUMBAR SPINE - COMPLETE 4+ VIEW COMPARISON:  None. FINDINGS: Five views of the lumbar spine submitted. No acute fracture or subluxation. Alignment and vertebral body heights are preserved. Minimal disc space flattening at L4-L5 and L5-S1 level. IMPRESSION: No acute fracture or subluxation. Minimal disc space flattening at L4-L5 and L5-S1 level. Electronically Signed   By: Natasha MeadLiviu  Pop M.D.   On: 04/10/2016 09:58    Procedures Procedures (including critical care time)  Medications Ordered in ED Medications  ketorolac (TORADOL) injection 30 mg (30 mg Intramuscular Given 04/10/16 0926)     Initial Impression / Assessment and Plan / ED Course  I have reviewed the triage vital signs and the nursing notes.  Pertinent labs & imaging results that were available during my care of the patient were reviewed by me and considered in my medical decision making (see chart for details).  Clinical Course    Patient with back pain after leg lift exercises.  No neurological deficits and normal neuro exam. X-ray was obtained due to midline lumbar spinal tenderness and is negative for acute findings. Patient is ambulatory.  No loss of bowel or bladder control.  No concern for cauda equina.  No fever, night sweats, weight loss, h/o cancer, IVDA, no recent procedure to back. No urinary symptoms suggestive of UTI.  Supportive care and return precaution discussed. Appears safe for discharge at this time. Follow up as indicated in discharge paperwork.    Final Clinical Impressions(s) / ED Diagnoses   Final diagnoses:  Lumbar strain, initial encounter    New Prescriptions Discharge  Medication List as of 04/10/2016 10:14 AM    START taking these medications   Details  ibuprofen (ADVIL,MOTRIN) 800 MG tablet Take 1 tablet (800 mg total) by mouth 3 (three) times daily., Starting Tue 04/10/2016, Print    methocarbamol (ROBAXIN) 500 MG tablet Take 1 tablet (500 mg total) by mouth 2 (two) times daily., Starting Tue 04/10/2016, Print        I personally performed the services described in this documentation, which was scribed in my presence. The recorded information has been reviewed and is accurate.    Carlene CoriaSerena Y Nuha Degner, PA-C 04/10/16 1026    Gerhard Munchobert Lockwood, MD 04/11/16 [phone]

## 2016-09-05 ENCOUNTER — Emergency Department (HOSPITAL_COMMUNITY)
Admission: EM | Admit: 2016-09-05 | Discharge: 2016-09-05 | Disposition: A | Discharge status: Home / Self Care | Payer: Medicaid Other | Attending: Emergency Medicine | Admitting: Emergency Medicine

## 2016-09-05 ENCOUNTER — Encounter (HOSPITAL_COMMUNITY): Payer: Self-pay | Admitting: Emergency Medicine

## 2016-09-05 DIAGNOSIS — I10 Essential (primary) hypertension: Secondary | ICD-10-CM | POA: Insufficient documentation

## 2016-09-05 DIAGNOSIS — Z91010 Allergy to peanuts: Secondary | ICD-10-CM | POA: Insufficient documentation

## 2016-09-05 DIAGNOSIS — N3000 Acute cystitis without hematuria: Secondary | ICD-10-CM | POA: Diagnosis not present

## 2016-09-05 DIAGNOSIS — R102 Pelvic and perineal pain: Secondary | ICD-10-CM

## 2016-09-05 DIAGNOSIS — R1030 Lower abdominal pain, unspecified: Secondary | ICD-10-CM | POA: Diagnosis present

## 2016-09-05 LAB — URINALYSIS, ROUTINE W REFLEX MICROSCOPIC
BILIRUBIN URINE: NEGATIVE
Glucose, UA: NEGATIVE mg/dL
Hgb urine dipstick: NEGATIVE
KETONES UR: NEGATIVE mg/dL
NITRITE: NEGATIVE
Protein, ur: NEGATIVE mg/dL
SPECIFIC GRAVITY, URINE: 1.012 (ref 1.005–1.030)
pH: 5 (ref 5.0–8.0)

## 2016-09-05 LAB — PREGNANCY, URINE: PREG TEST UR: NEGATIVE

## 2016-09-05 MED ORDER — NITROFURANTOIN MONOHYD MACRO 100 MG PO CAPS: 100.0000 mg | ORAL_CAPSULE | Freq: Two times a day (BID) | ORAL | 0 refills | Status: DC

## 2016-09-05 NOTE — ED Triage Notes (Signed)
Pt states I think I have a kidney infection. Pt has flank pain and abd pain. No burning with urination. Frequent urination.

## 2016-09-05 NOTE — ED Provider Notes (Signed)
[facility]PT Provider Note   CSN: [id number] Arrival date & time: 09/05/16  1118  By signing my name below, I, Teofilo PodMatthew P. Jamison, attest that this documentation has been prepared under the direction and in the presence of Derwood KaplanAnkit Gabbriella Presswood, MD . Electronically Signed: Teofilo PodMatthew P. Jamison, ED Scribe. 09/05/2016. 12:53 PM.    History   Chief Complaint Chief Complaint  Patient presents with  . Flank Pain   The history is provided by the patient. No language interpreter was used.   HPI Comments:  Sabrina Bond is a 41 y.o. female with PMHx of UTI who presents to the Emergency Department complaining of constant lower abdominal pain and lower back pain x 3 days. Pt states that she has been drinking more water and cranberry juice because she thought she had a bladder infection. Pt notes that after she finishes urinating she starts having abdominal pain. LNMP was 2 weeks ago, and she denies any recent unprotected sex, change of pregnancy or history of STDs in the past year.  No alleviating factors noted. Pt denies urinary frequency, blood in urine, vaginal discharge.    Past Medical History:  Diagnosis Date  . Arthritis   . Hypertension     Patient Active Problem List   Diagnosis Date Noted  . 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 CHOLECYSTECTOMY    . ENDOMETRIAL ABLATION    . TUBAL LIGATION      OB History    No data available       Home Medications    Prior to Admission medications   Medication Sig Start Date End Date Taking? Authorizing Provider  fluticasone (FLONASE) 50 MCG/ACT nasal spray Place 1 spray into both nostrils daily. Patient not taking: Reported on 10/24/2015 05/01/15   Mancel BaleElliott Wentz, MD  ibuprofen (ADVIL,MOTRIN) 800 MG tablet Take 1 tablet (800 mg total) by mouth 3 (three) times daily. 04/10/16   Ace GinsSerena Y Sam, PA-C  losartan-hydrochlorothiazide (HYZAAR) 100-12.5 MG per tablet Take 1 tablet by mouth daily.    Historical  Provider, MD  meclizine (ANTIVERT) 25 MG tablet Take 1 tablet (25 mg total) by mouth 3 (three) times daily as needed for dizziness. 10/24/15   Pricilla LovelessScott Goldston, MD  methocarbamol (ROBAXIN) 500 MG tablet Take 1 tablet (500 mg total) by mouth 2 (two) times daily. 04/10/16   Ace GinsSerena Y Sam, PA-C  nitrofurantoin, macrocrystal-monohydrate, (MACROBID) 100 MG capsule Take 1 capsule (100 mg total) by mouth 2 (two) times daily. 09/05/16   Derwood KaplanAnkit Rosabella Edgin, MD    Family History Family History  Problem Relation Age of Onset  . Hypertension Mother   . Hypertension Father     Social History Social History  Substance Use Topics  . Smoking status: Never Smoker  . Smokeless tobacco: Never Used  . Alcohol use No     Allergies   Kiwi extract; Metoprolol; Benicar [olmesartan]; Peanut-containing drug products; and Naproxen   Review of Systems Review of Systems  Gastrointestinal: Positive for abdominal pain.  Genitourinary: Negative for frequency, hematuria and vaginal discharge.  Musculoskeletal: Positive for back pain.  All other systems reviewed and are negative.    Physical Exam Updated Vital Signs BP 135/69 (BP Location: Right Arm)   Pulse 66   Temp 98.5 F (36.9 C) (Oral)   Resp 16   Ht 5\' 4"  (1.626 m)   Wt 220 lb (99.8 kg)   LMP 08/20/2016   SpO2 100%   BMI 37.76 kg/m  Physical Exam  Constitutional: She appears well-developed and well-nourished. No distress.  HENT:  Head: Normocephalic and atraumatic.  Eyes: Conjunctivae are normal.  Cardiovascular: Normal rate.   Pulmonary/Chest: Effort normal.  Abdominal: She exhibits no distension.  Suprapubic TTP. No TTP over the flank region.   Neurological: She is alert.  Skin: Skin is warm and dry.  Psychiatric: She has a normal mood and affect.  Nursing note and vitals reviewed.    ED Treatments / Results  DIAGNOSTIC STUDIES:  Oxygen Saturation is 100% on RA, normal by my interpretation.    COORDINATION OF CARE:  12:53 PM  Discussed treatment plan with pt at bedside and pt agreed to plan.   Labs (all labs ordered are listed, but only abnormal results are displayed) Labs Reviewed  URINALYSIS, ROUTINE W REFLEX MICROSCOPIC - Abnormal; Notable for the following:       Result Value   APPearance HAZY (*)    Leukocytes, UA LARGE (*)    Bacteria, UA RARE (*)    Squamous Epithelial / LPF 6-30 (*)    All other components within normal limits  WET PREP, GENITAL  PREGNANCY, URINE  GC/CHLAMYDIA PROBE AMP ([place]) NOT AT [facility]    EKG  EKG Interpretation None       Radiology No results found.  Procedures Procedures (including critical care time)  Medications Ordered in ED Medications - No data to display   Initial Impression / Assessment and Plan / ED Course  I have reviewed the triage vital signs and the nursing notes.  Pertinent labs & imaging results that were available during my care of the patient were reviewed by me and considered in my medical decision making (see chart for details).  Clinical Course   3:22 PM Repeat assessment. Results from ER workup have been discussed with the patient. She is aware that the UA is not clearly showing infection. We requested patient to proceed with pelvic exam, but she would prefer that he outpatient doctor see her for that. Patient has suprapubic tenderness only on repeat abdominal exam, and she reports having similar symptoms with her UTI in the past. Therefore we will clinically treat patient as a UTI. Strict return precautions have been discussed.    Pt comes in with cc of abd pain. Pt really has suprapubic pain. She reports some urinary discomfort. DDX Cystitis, STD. Pt reports having an annual 2 months ago, which included STD eval and reports not engaging in any high risk sexual activity. Denies hx of STD. If UA is clean - we will discuss moving forward with pelvic exam.  Final Clinical Impressions(s) / ED Diagnoses   Final diagnoses:  Acute  cystitis without hematuria  Pelvic pain    New Prescriptions Discharge Medication List as of 09/05/2016  2:22 PM    START taking these medications   Details  nitrofurantoin, macrocrystal-monohydrate, (MACROBID) 100 MG capsule Take 1 capsule (100 mg total) by mouth 2 (two) times daily., Starting Wed 09/05/2016, Print      I personally performed the services described in this documentation, which was scribed in my presence. The recorded information has been reviewed and is accurate.      Derwood Kaplan, MD 09/05/16 1525

## 2017-07-24 ENCOUNTER — Emergency Department (HOSPITAL_COMMUNITY)
Admission: EM | Admit: 2017-07-24 | Discharge: 2017-07-24 | Disposition: A | Discharge status: Home / Self Care | Payer: Medicaid Other | Attending: Emergency Medicine | Admitting: Emergency Medicine

## 2017-07-24 ENCOUNTER — Emergency Department (HOSPITAL_COMMUNITY): Payer: Medicaid Other

## 2017-07-24 ENCOUNTER — Other Ambulatory Visit: Payer: Self-pay

## 2017-07-24 ENCOUNTER — Encounter (HOSPITAL_COMMUNITY): Payer: Self-pay | Admitting: Emergency Medicine

## 2017-07-24 DIAGNOSIS — Y929 Unspecified place or not applicable: Secondary | ICD-10-CM | POA: Insufficient documentation

## 2017-07-24 DIAGNOSIS — Y990 Civilian activity done for income or pay: Secondary | ICD-10-CM | POA: Insufficient documentation

## 2017-07-24 DIAGNOSIS — Z79899 Other long term (current) drug therapy: Secondary | ICD-10-CM | POA: Insufficient documentation

## 2017-07-24 DIAGNOSIS — Y939 Activity, unspecified: Secondary | ICD-10-CM | POA: Insufficient documentation

## 2017-07-24 DIAGNOSIS — X501XXA Overexertion from prolonged static or awkward postures, initial encounter: Secondary | ICD-10-CM | POA: Insufficient documentation

## 2017-07-24 DIAGNOSIS — Z91010 Allergy to peanuts: Secondary | ICD-10-CM | POA: Insufficient documentation

## 2017-07-24 DIAGNOSIS — I10 Essential (primary) hypertension: Secondary | ICD-10-CM | POA: Insufficient documentation

## 2017-07-24 DIAGNOSIS — S39012A Strain of muscle, fascia and tendon of lower back, initial encounter: Secondary | ICD-10-CM | POA: Insufficient documentation

## 2017-07-24 DIAGNOSIS — M545 Low back pain: Secondary | ICD-10-CM | POA: Diagnosis present

## 2017-07-24 MED ORDER — CYCLOBENZAPRINE HCL 10 MG PO TABS: 10.0000 mg | ORAL_TABLET | Freq: Three times a day (TID) | ORAL | 0 refills | Status: DC | PRN

## 2017-07-24 MED ORDER — TRAMADOL HCL 50 MG PO TABS: 50.0000 mg | ORAL_TABLET | Freq: Four times a day (QID) | ORAL | 0 refills | Status: DC | PRN

## 2017-07-24 MED ORDER — TRAMADOL HCL 50 MG PO TABS
50.0000 mg | ORAL_TABLET | Freq: Once | ORAL | Status: AC
  Administered 2017-07-24: 50 mg via ORAL
  Filled 2017-07-24: qty 1

## 2017-07-24 NOTE — Discharge Instructions (Signed)
It was our pleasure to provide your ER care today - we hope that you feel better.  Avoid bending at waist or heavy lifting > 10 lbs for the next few days.  Heat to sore area.   Take ultram as need for pain - no driving for the next 6 hours or when taking ultram.  Take flexeril as need for muscle spasm - no driving when taking.  Follow up with primary care doctor in 1 week if symptoms fail to improve/resolve.  Return to ER if worse, new symptoms, fevers, intractable pain, numbness/weakness, other concern.

## 2017-07-24 NOTE — ED Provider Notes (Signed)
[name]   CSN: [id number] Arrival date & time: 07/24/17  0913     History   Chief Complaint Chief Complaint  Patient presents with  . Back Pain    HPI Sabrina Bond is a 41 y.o. female.  Patient c/o low back pain for the past couple days. Pt indicates hx lumbar strain, feels similar. States her work involves standing for long hours, and feels that along with having bad shoes contributed to her pain. Denies trauma or fall. Pain is lower back, constant, dull, moderate, non radiating, worse w bending and position changes. No radicular or leg pain. No numbness/weakness. No fever or chills. No anterior pain - no abd or pelvic pain. No gi or gu c/o.    The history is provided by the patient.  Back Pain   Pertinent negatives include no fever, no numbness, no abdominal pain, no dysuria and no weakness.    Past Medical History:  Diagnosis Date  . Arthritis   . Hypertension     Patient Active Problem List   Diagnosis Date Noted  . 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 CHOLECYSTECTOMY    . ENDOMETRIAL ABLATION    . TUBAL LIGATION      OB History    No data available       Home Medications    Prior to Admission medications   Medication Sig Start Date End Date Taking? Authorizing Provider  fluticasone (FLONASE) 50 MCG/ACT nasal spray Place 1 spray into both nostrils daily. Patient not taking: Reported on 10/24/2015 05/01/15   Mancel BaleWentz, Elliott, MD  ibuprofen (ADVIL,MOTRIN) 800 MG tablet Take 1 tablet (800 mg total) by mouth 3 (three) times daily. 04/10/16   Sam, Ace GinsSerena Y, PA-C  losartan-hydrochlorothiazide (HYZAAR) 100-12.5 MG per tablet Take 1 tablet by mouth daily.    [provider]  meclizine (ANTIVERT) 25 MG tablet Take 1 tablet (25 mg total) by mouth 3 (three) times daily as needed for dizziness. 10/24/15   Pricilla LovelessGoldston, Scott, MD  methocarbamol (ROBAXIN) 500 MG tablet Take  1 tablet (500 mg total) by mouth 2 (two) times daily. 04/10/16   Sam, Ace GinsSerena Y, PA-C  nitrofurantoin, macrocrystal-monohydrate, (MACROBID) 100 MG capsule Take 1 capsule (100 mg total) by mouth 2 (two) times daily. 09/05/16   Derwood KaplanNanavati, Ankit, MD    Family History Family History  Problem Relation Age of Onset  . Hypertension Mother   . Hypertension Father     Social History Social History   Tobacco Use  . Smoking status: Never Smoker  . Smokeless tobacco: Never Used  Substance Use Topics  . Alcohol use: No  . Drug use: No     Allergies   Kiwi extract; Metoprolol; Benicar [olmesartan]; Peanut-containing drug products; and Naproxen   Review of Systems Review of Systems  Constitutional: Negative for chills and fever.  Gastrointestinal: Negative for abdominal pain.  Genitourinary: Negative for dysuria and hematuria.  Musculoskeletal: Positive for back pain.  Skin: Negative for rash.  Neurological: Negative for weakness and numbness.     Physical Exam Updated Vital Signs BP (!) 141/64 (BP Location: Right Arm)   Pulse 62   Temp 98.9 F (37.2 C) (Oral)   Resp 16   LMP 07/10/2017 (Approximate)   SpO2 100%   Physical Exam  Constitutional: She appears well-developed and well-nourished. No distress.  HENT:  Head: Atraumatic.  Eyes: Conjunctivae are normal. No scleral icterus.  Neck: Neck  supple. No tracheal deviation present.  Cardiovascular: Normal rate, regular rhythm, normal heart sounds and intact distal pulses.  Pulmonary/Chest: Effort normal. No respiratory distress.  Abdominal: Normal appearance. She exhibits no distension.  Genitourinary:  Genitourinary Comments: No cva tenderness  Musculoskeletal: She exhibits no edema.  TLS spine non tender, aligned. Lumbar muscular tenderness. No sts or skin changes.   Neurological: She is alert.  Motor intact, stre 5/5. sens grossly intact. Steady gait.   Skin: Skin is warm and dry. No rash noted. She is not diaphoretic.    No rash/lesions.   Psychiatric: She has a normal mood and affect.  Nursing note and vitals reviewed.    ED Treatments / Results  Labs (all labs ordered are listed, but only abnormal results are displayed) Labs Reviewed - No data to display  EKG  EKG Interpretation None       Radiology Dg Lumbar Spine Complete  Result Date: 07/24/2017 CLINICAL DATA:  Low back pain, right worse than left. EXAM: LUMBAR SPINE - COMPLETE 4+ VIEW COMPARISON:  04/10/2016 FINDINGS: There are 5 nonrib bearing lumbar-type vertebral bodies. The vertebral body heights are maintained. The alignment is anatomic. There is no static listhesis. There is no spondylolysis. There is no acute fracture. There is minimal disc height loss at L4-5 and L5-S1. The SI joints are unremarkable. IMPRESSION: No acute osseous injury of the lumbar spine. Electronically Signed   By: Elige KoHetal  Patel   On: 07/24/2017 10:12    Procedures Procedures (including critical care time)  Medications Ordered in ED Medications  traMADol (ULTRAM) tablet 50 mg (not administered)     Initial Impression / Assessment and Plan / ED Course  I have reviewed the triage vital signs and the nursing notes.  Pertinent labs & imaging results that were available during my care of the patient were reviewed by me and considered in my medical decision making (see chart for details).  Pt indicates has taken no meds today for pain.  Has ride, does not have to drive. Ultram po.   xrays done from triage neg for acute process.  Symptoms/exam most c/w musculoskeletal lumbar pain/strain.   Reviewed nursing notes and prior charts for additional history.     Final Clinical Impressions(s) / ED Diagnoses   Final diagnoses:  None    ED Discharge Orders    None       Cathren LaineSteinl, Mads Borgmeyer, MD 07/24/17 1135

## 2017-07-24 NOTE — ED Triage Notes (Signed)
Pt here with c/o low back pain after standing  All day at work , pt has history of lower lumbar strain

## 2018-01-11 IMAGING — DX DG LUMBAR SPINE COMPLETE 4+V
5 series · 5 of 5 positions shown · non-contrast
Comparison: None.

CLINICAL DATA: Lumbar spine pain, right side pain

EXAM:
LUMBAR SPINE - COMPLETE 4+ VIEW

[t lumbar spine ap]
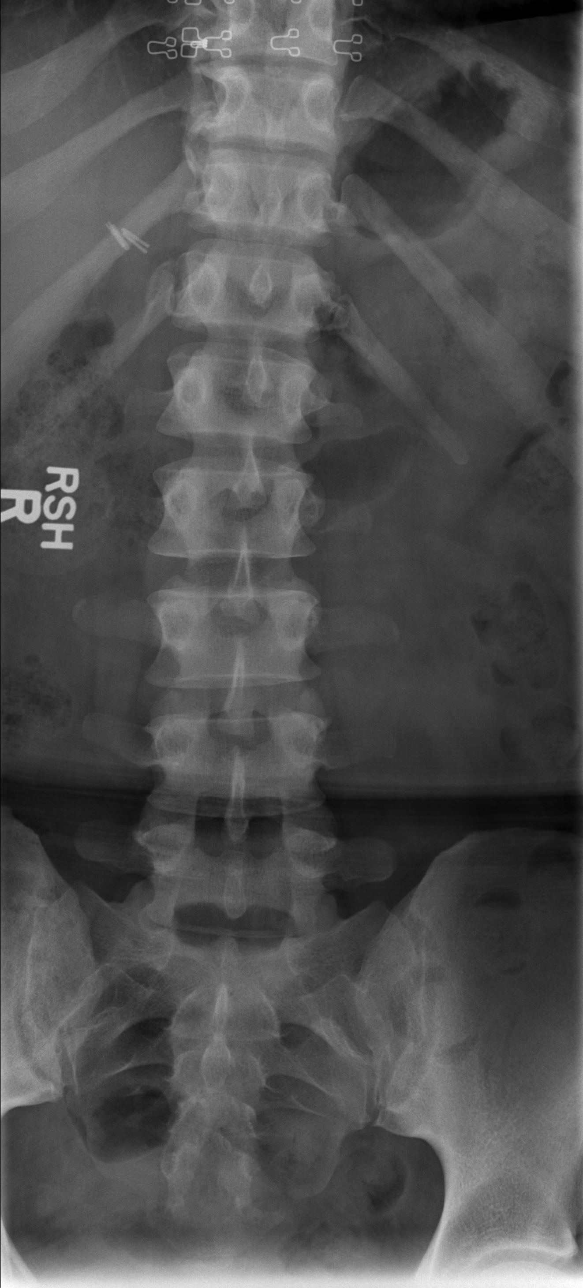

[t lumbar spine obl (1 of 2)]
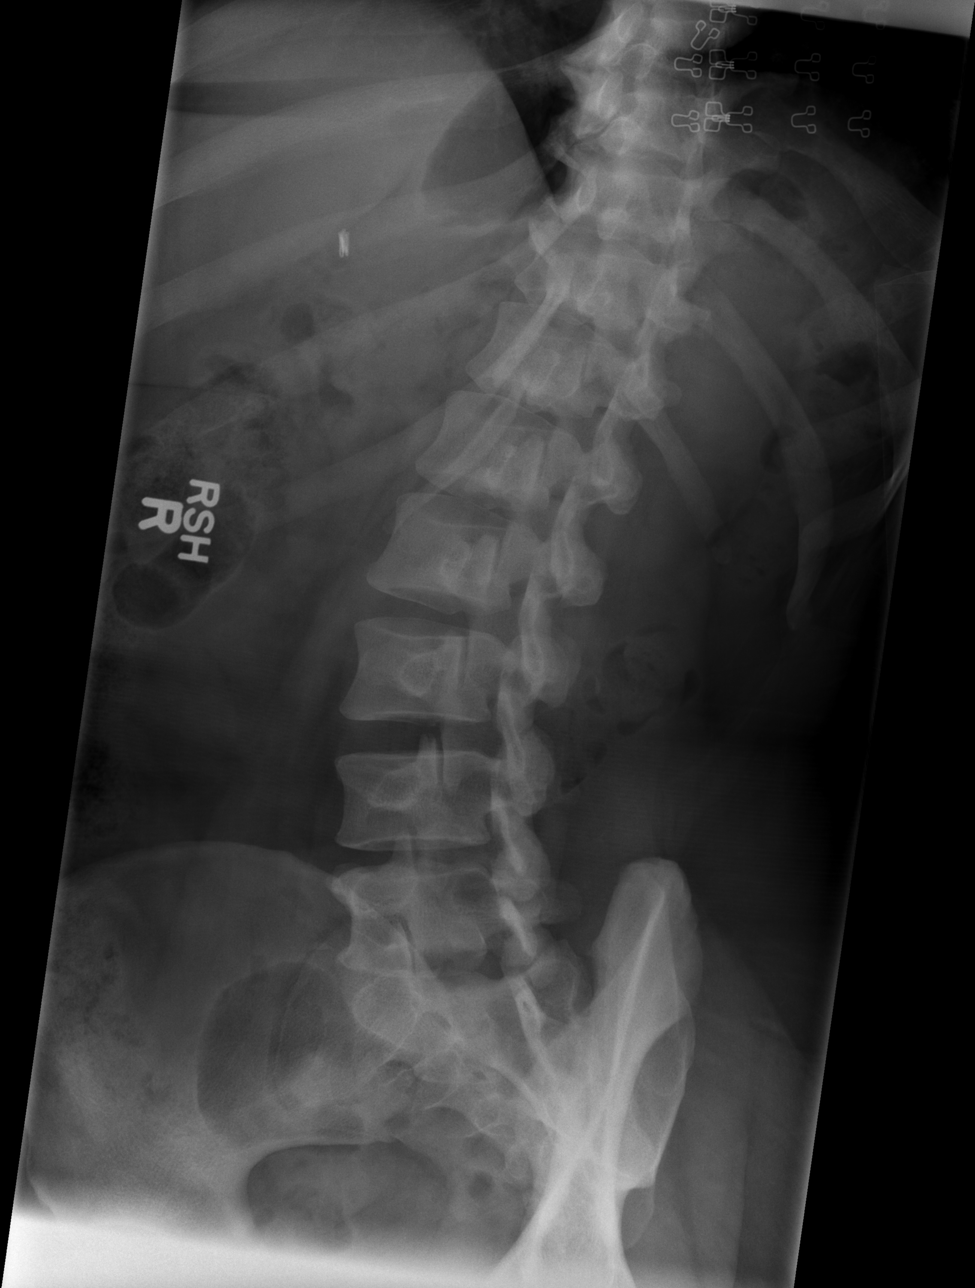

[t lumbar spine obl (2 of 2)]
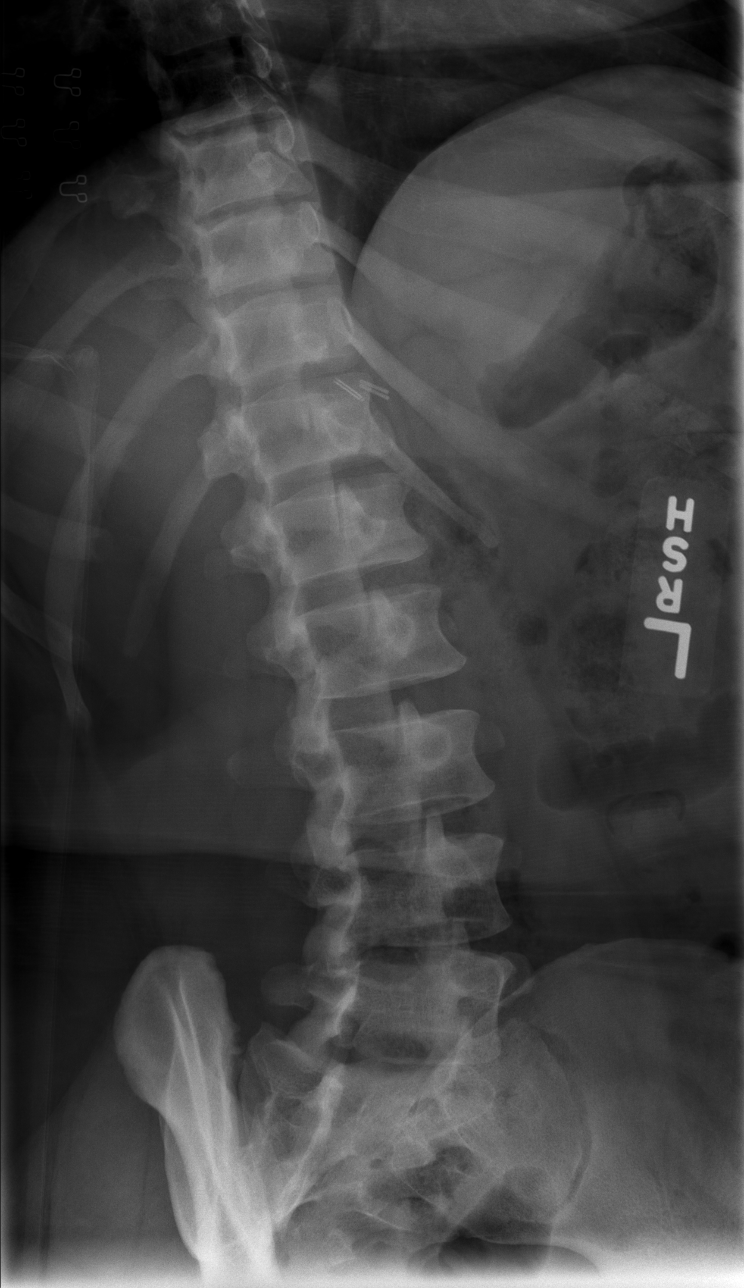

[t lumbar spine lat]
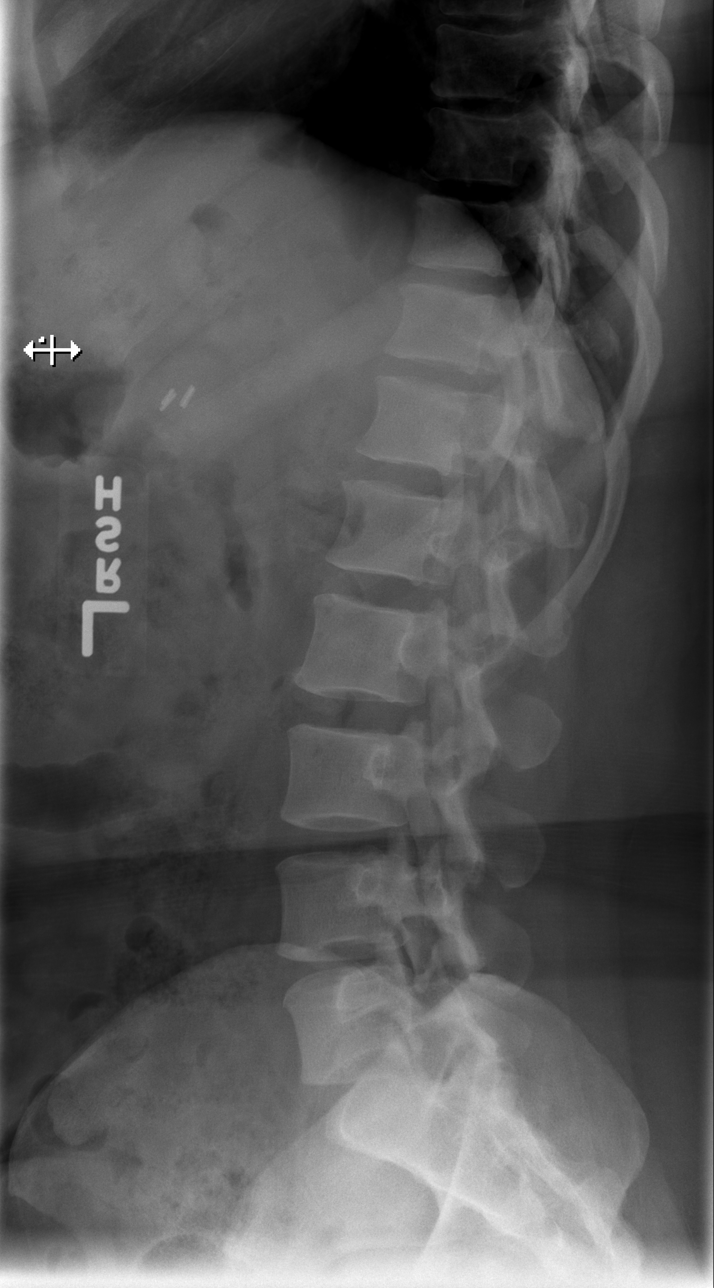

[t lumbar l-5 s-1 spot]
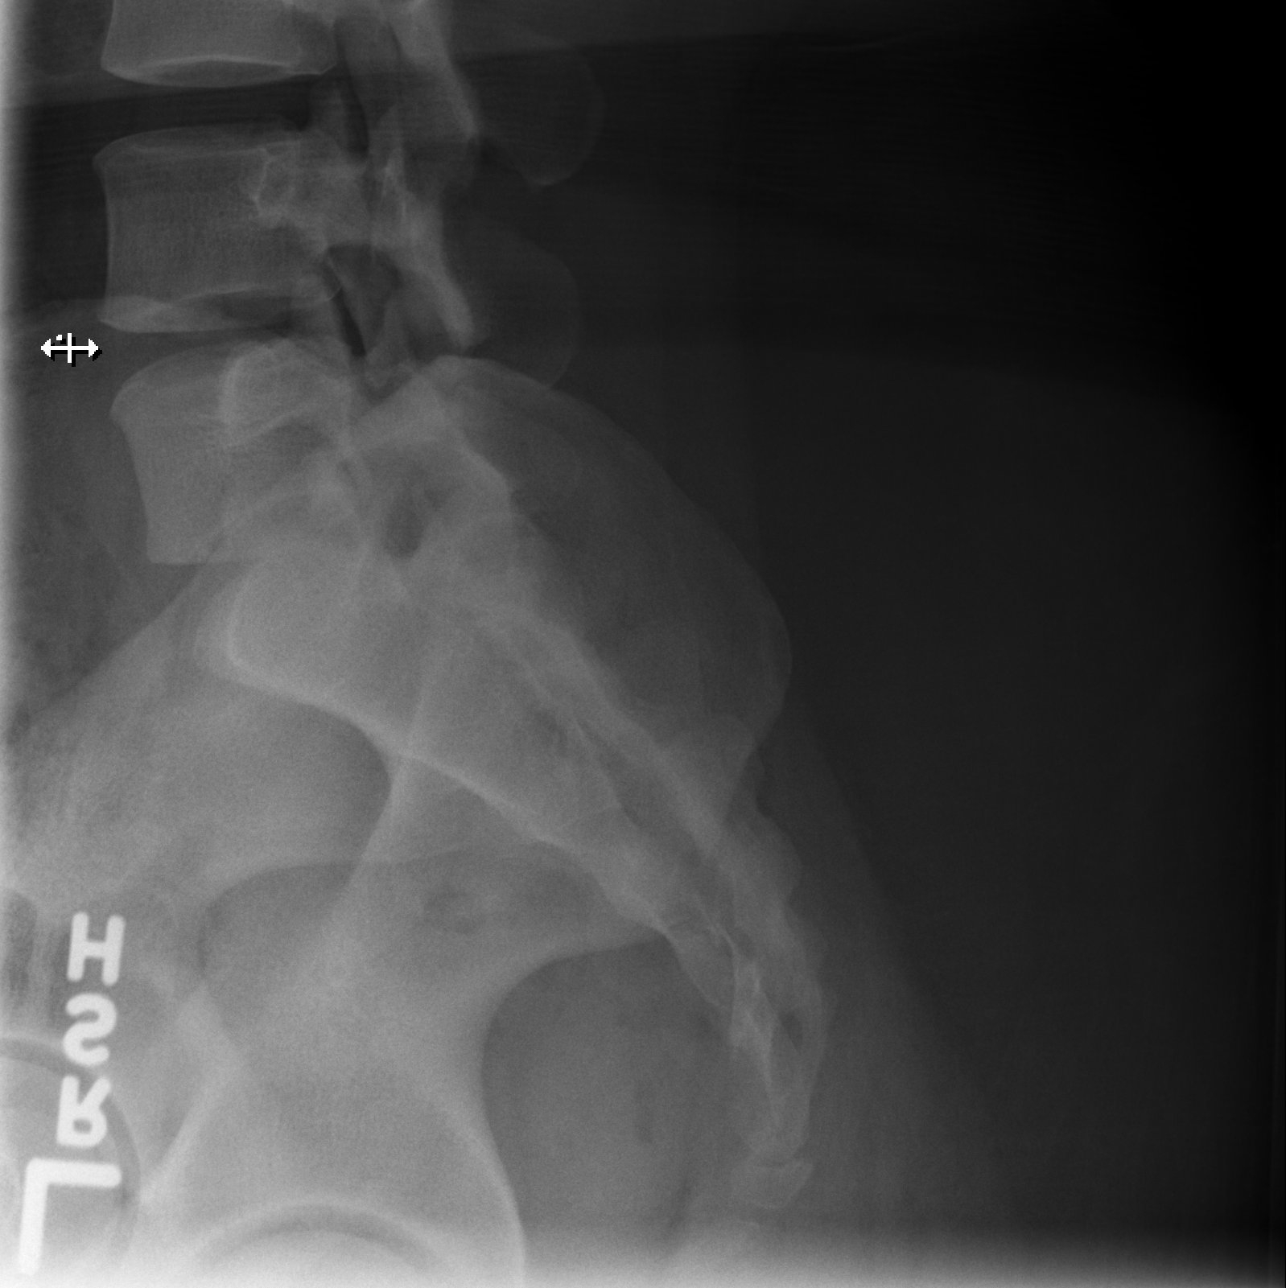

[5 of 5 positions shown; findings below may reference images not displayed]

FINDINGS: Five views of the lumbar spine submitted. No acute fracture or
subluxation. Alignment and vertebral body heights are preserved.
Minimal disc space flattening at L4-L5 and L5-S1 level.
IMPRESSION: No acute fracture or subluxation. Minimal disc space flattening at
L4-L5 and L5-S1 level.

## 2018-03-20 ENCOUNTER — Encounter (HOSPITAL_COMMUNITY): Payer: Self-pay | Admitting: *Deleted

## 2018-03-20 ENCOUNTER — Emergency Department (HOSPITAL_COMMUNITY)
Admission: EM | Admit: 2018-03-20 | Discharge: 2018-03-20 | Disposition: A | Discharge status: Home / Self Care | Payer: Medicaid Other | Attending: Emergency Medicine | Admitting: Emergency Medicine

## 2018-03-20 DIAGNOSIS — I10 Essential (primary) hypertension: Secondary | ICD-10-CM | POA: Insufficient documentation

## 2018-03-20 DIAGNOSIS — R1013 Epigastric pain: Secondary | ICD-10-CM | POA: Insufficient documentation

## 2018-03-20 DIAGNOSIS — Z79899 Other long term (current) drug therapy: Secondary | ICD-10-CM | POA: Insufficient documentation

## 2018-03-20 HISTORY — DX: Diverticulitis of intestine, part unspecified, without perforation or abscess without bleeding: K57.92

## 2018-03-20 LAB — COMPREHENSIVE METABOLIC PANEL
ALBUMIN: 3.7 g/dL (ref 3.5–5.0)
ALK PHOS: 56 U/L (ref 38–126)
ALT: 11 U/L (ref 0–44)
ANION GAP: 6 (ref 5–15)
AST: 14 U/L — ABNORMAL LOW (ref 15–41)
BILIRUBIN TOTAL: 0.5 mg/dL (ref 0.3–1.2)
BUN: 23 mg/dL — ABNORMAL HIGH (ref 6–20)
CALCIUM: 9.3 mg/dL (ref 8.9–10.3)
CO2: 22 mmol/L (ref 22–32)
CREATININE: 1.3 mg/dL — AB (ref 0.44–1.00)
Chloride: 109 mmol/L (ref 98–111)
GFR calc Af Amer: 58 mL/min — ABNORMAL LOW (ref >60)
GFR calc non Af Amer: 50 mL/min — ABNORMAL LOW (ref >60)
GLUCOSE: 108 mg/dL — AB (ref 70–99)
Potassium: 4.4 mmol/L (ref 3.5–5.1)
Sodium: 137 mmol/L (ref 135–145)
TOTAL PROTEIN: 7.1 g/dL (ref 6.5–8.1)

## 2018-03-20 LAB — CBC
HCT: 31.7 % — ABNORMAL LOW (ref 36.0–46.0)
HEMOGLOBIN: 10.4 g/dL — AB (ref 12.0–15.0)
MCH: 27.3 pg (ref 26.0–34.0)
MCHC: 32.8 g/dL (ref 30.0–36.0)
MCV: 83.2 fL (ref 78.0–100.0)
PLATELETS: 213 10*3/uL (ref 150–400)
RBC: 3.81 MIL/uL — ABNORMAL LOW (ref 3.87–5.11)
RDW: 14.2 % (ref 11.5–15.5)
WBC: 6.9 10*3/uL (ref 4.0–10.5)

## 2018-03-20 LAB — LIPASE, BLOOD: Lipase: 43 U/L (ref 11–51)

## 2018-03-20 LAB — POC OCCULT BLOOD, ED: Fecal Occult Bld: POSITIVE — AB

## 2018-03-20 LAB — I-STAT BETA HCG BLOOD, ED (MC, WL, AP ONLY)

## 2018-03-20 MED ORDER — GI COCKTAIL ~~LOC~~
30.0000 mL | Freq: Once | ORAL | Status: AC
  Administered 2018-03-20: 30 mL via ORAL
  Filled 2018-03-20: qty 30

## 2018-03-20 MED ORDER — FERROUS SULFATE 325 (65 FE) MG PO TABS: 325.0000 mg | ORAL_TABLET | Freq: Every day | ORAL | 0 refills | Status: DC

## 2018-03-20 MED ORDER — SUCRALFATE 1 GM/10ML PO SUSP: 1.0000 g | Freq: Three times a day (TID) | ORAL | 0 refills | Status: DC

## 2018-03-20 MED ORDER — FAMOTIDINE 20 MG PO TABS
20.0000 mg | ORAL_TABLET | Freq: Once | ORAL | Status: AC
  Administered 2018-03-20: 20 mg via ORAL
  Filled 2018-03-20: qty 1

## 2018-03-20 NOTE — Discharge Instructions (Addendum)
You were seen in the emergency department for pain to your upper middle abdomen.  Your work-up today was at baseline without any new changes.  Your hemoglobin is considered to be low at 10.4, however when compared to previous lab test this is your baseline and is unchanged.  Kidney function is also slightly elevated but again when compared to previous this is at baseline.  Stool was barely positive and inconclusive.    Labs otherwise looked okay.  I suspect your symptoms are most likely from either gastritis, acid reflux or possible developing ulcer.  We will treat your symptoms with omeprazole 20 mg daily, famotidine 20 mg daily and Carafate.  Monitor your diet, avoid any greasy, fatty, acidic foods and drinks.  Avoid ibuprofen (advil, motrin), aleve and aspirin as these cause more stomach irritation.   Start taking iron supplements with miralax and stool softener.   Return to the ER for worsening pain, pain radiating into your chest, shortness of breath, blood in your vomit, blood in your stools or black stools.

## 2018-03-20 NOTE — ED Triage Notes (Signed)
Pt in c/o upper abdominal pain after eating for the last few weeks, denies n/v/d

## 2018-03-20 NOTE — ED Provider Notes (Signed)
[name] EMERGENCY DEPARTMENT Provider Note   CSN: [id number] Arrival date & time: 03/20/18  8295     History   Chief Complaint Chief Complaint  Patient presents with  . Abdominal Pain    HPI Sabrina Bond is a 42 y.o. female with h/o HTN and iron deficiency anemia, here for evaluation of epigastric abdominal pain.  Described as dull, nonradiating.   Onset 3.5 weeks ago.  Worse with eating and drinking. Now present also after eating and lingering.  Aggravated by palpation as well and eating/drinking cold things.  Has been having to eat soup or chicken broth.  History of cholecystectomy.  History of chronic constipation, bowel movements every 3 days that has been unchanged.  No other abdominal surgeries.  Denies associated fevers, cough, chest pain, shortness of breath, radiation of pain into the chest, nausea, vomiting, increased belching or gas, diarrhea, blood in stool, melena.  No history of pancreatitis.  No EtOH or tobacco use.  States that symptoms began at the same time she began a low-carb diet.  Has lost 20# in 3.5 weeks. She is putting lemon in her water.   HPI  Past Medical History:  Diagnosis Date  . Arthritis   . Diverticulitis   . Hypertension     Patient Active Problem List   Diagnosis Date Noted  . 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 CHOLECYSTECTOMY    . ENDOMETRIAL ABLATION    . TUBAL LIGATION       OB History   None      Home Medications    Prior to Admission medications   Medication Sig Start Date End Date Taking? Authorizing Provider  losartan-hydrochlorothiazide (HYZAAR) 100-12.5 MG per tablet Take 0.5 tablets by mouth daily.    Yes [provider]  cyclobenzaprine (FLEXERIL) 10 MG tablet Take 1 tablet (10 mg total) by mouth 3 (three) times daily as needed for muscle spasms. Patient not taking: Reported on 03/20/2018 07/24/17   Cathren Laine, MD  fluticasone [facility]) 50  MCG/ACT nasal spray Place 1 spray into both nostrils daily. Patient not taking: Reported on 10/24/2015 05/01/15   Mancel Bale, MD  ibuprofen (ADVIL,MOTRIN) 800 MG tablet Take 1 tablet (800 mg total) by mouth 3 (three) times daily. Patient not taking: Reported on 03/20/2018 04/10/16   Sam, Ace Gins, PA-C  meclizine (ANTIVERT) 25 MG tablet Take 1 tablet (25 mg total) by mouth 3 (three) times daily as needed for dizziness. Patient not taking: Reported on 03/20/2018 10/24/15   Pricilla Loveless, MD  methocarbamol (ROBAXIN) 500 MG tablet Take 1 tablet (500 mg total) by mouth 2 (two) times daily. Patient not taking: Reported on 03/20/2018 04/10/16   Sam, Ace Gins, PA-C  traMADol (ULTRAM) 50 MG tablet Take 1 tablet (50 mg total) by mouth every 6 (six) hours as needed. Patient not taking: Reported on 03/20/2018 07/24/17   Cathren Laine, MD    Family History Family History  Problem Relation Age of Onset  . Hypertension Mother   . Hypertension Father     Social History Social History   Tobacco Use  . Smoking status: Never Smoker  . Smokeless tobacco: Never Used  Substance Use Topics  . Alcohol use: No  . Drug use: No     Allergies   Kiwi extract; Metoprolol; Benicar [olmesartan]; Peanut-containing drug products; and Naproxen   Review of Systems Review of Systems  Gastrointestinal: Positive for abdominal pain.  All other systems  reviewed and are negative.    Physical Exam Updated Vital Signs BP 120/68 (BP Location: Right Arm)   Pulse (!) 53   Temp 98 F (36.7 C) (Oral)   Resp 18   SpO2 100%   Physical Exam  Constitutional: She is oriented to person, place, and time. She appears well-developed and well-nourished. No distress.  Non toxic  HENT:  Head: Normocephalic and atraumatic.  Nose: Nose normal.  Eyes: Pupils are equal, round, and reactive to light. Conjunctivae and EOM are normal.  Neck: Normal range of motion.  Cardiovascular: Normal rate, regular rhythm and intact distal  pulses.  2+ DP and radial pulses bilaterally.   Pulmonary/Chest: Effort normal and breath sounds normal.  Abdominal: Soft. Bowel sounds are normal. There is tenderness in the epigastric area.  No G/R/R. No suprapubic or CVA tenderness. Negative Murphy's and McBurney's.   Genitourinary: Rectal exam shows guaiac positive stool.  Genitourinary Comments: Minimal stool on glove, brown without bright red blood or melena. RN in room during exam.   Musculoskeletal: Normal range of motion.  Neurological: She is alert and oriented to person, place, and time.  Skin: Skin is warm and dry. Capillary refill takes less than 2 seconds.  Psychiatric: She has a normal mood and affect. Her behavior is normal.  Nursing note and vitals reviewed.    ED Treatments / Results  Labs (all labs ordered are listed, but only abnormal results are displayed) Labs Reviewed  COMPREHENSIVE METABOLIC PANEL - Abnormal; Notable for the following components:      Result Value   Glucose, Bld 108 (*)    BUN 23 (*)    Creatinine, Ser 1.30 (*)    AST 14 (*)    GFR calc non Af Amer 50 (*)    GFR calc Af Amer 58 (*)    All other components within normal limits  CBC - Abnormal; Notable for the following components:   RBC 3.81 (*)    Hemoglobin 10.4 (*)    HCT 31.7 (*)    All other components within normal limits  POC OCCULT BLOOD, ED - Abnormal; Notable for the following components:   Fecal Occult Bld POSITIVE (*)    All other components within normal limits  LIPASE, BLOOD  I-STAT BETA HCG BLOOD, ED ([facility], [facility], [facility] ONLY)    EKG None  Radiology No results found.  Procedures Procedures (including critical care time)  Medications Ordered in ED Medications  gi cocktail (Maalox,Lidocaine,Donnatal) (30 mLs Oral Given 03/20/18 0948)  famotidine (PEPCID) tablet 20 mg (20 mg Oral Given 03/20/18 0948)     Initial Impression / Assessment and Plan / ED Course  I have reviewed the triage vital signs and the nursing  notes.  Pertinent labs & imaging results that were available during my care of the patient were reviewed by me and considered in my medical decision making (see chart for details).  Clinical Course as of Mar 20 1206  Thu Mar 20, 2018  1112 Creatinine(!): 1.30 [CG]  1112 GFR, Est African American(!): 58 [CG]  1112 Hemoglobin(!): 10.4 [CG]  1129 Reevaluated patient, states that her symptoms are better after Pepcid and GI cocktail.   [CG]    Clinical Course User Index [CG] Liberty HandyGibbons, Prince Olivier J, PA-C    42 year old female with history of HTN is here for epigastric discomfort worse with eating however now lingering afterwards as well.  Recent diet increased lemon water.  Considering GERD versus PUD versus pancreatitis vs gastritis.  She is  s/p cholecystectomy.  Considered ACS/MI or pneumonia however she has minimal risk factors, has lost significant weight, blood pressure is well controlled, she has no cough or fevers.  Doubt dissection as her pain is worse with eating, normotensive today.  Will obtain screening labs and give GI cocktail and Pepcid and reassess.   She is denying melena, hematochezia, hematemesis.   Work up remarkable for chronic stable anemia, pt confirms h/o iron deficiency anemia not taking iron. She has no melena, hematochezia. I performed rectal exam given chronic anemia, before pt confirmed h/o iron deficiency non compliant with iron.  On my rectal exam there was no gross blood, minimal stool obtained.  Hemoccult barely positive, only a tiny corner blue and inconclusive. Favoring iron deficiency chronic anemia, unchanged hgb 10.4 in 2017 > 10.4. Discussed this with patient. Pt is considered safe for discharge, I do not think her anemia is from acute GI bleed.  Will discharge with omeprazole, famotidine, carafate. GI f/u in 1-2 weeks for re-evaluation.  Discussed diet precautions. Discussed return precautions.   Final Clinical Impressions(s) / ED Diagnoses   Final diagnoses:   Epigastric abdominal pain    ED Discharge Orders    None       Jerrell Mylar 03/20/18 1207    Little, Ambrose Finland, MD 03/20/18 1221

## 2018-08-11 ENCOUNTER — Other Ambulatory Visit (HOSPITAL_COMMUNITY): Payer: Self-pay | Admitting: *Deleted

## 2018-08-11 DIAGNOSIS — N6320 Unspecified lump in the left breast, unspecified quadrant: Secondary | ICD-10-CM

## 2018-08-28 ENCOUNTER — Ambulatory Visit
Admission: RE | Admit: 2018-08-28 | Discharge: 2018-08-28 | Disposition: A | Discharge status: Home / Self Care | Payer: Self-pay | Source: Ambulatory Visit | Attending: Obstetrics and Gynecology | Admitting: Obstetrics and Gynecology

## 2018-08-28 ENCOUNTER — Encounter (HOSPITAL_COMMUNITY): Payer: Self-pay

## 2018-08-28 ENCOUNTER — Ambulatory Visit (HOSPITAL_COMMUNITY)
Admission: RE | Admit: 2018-08-28 | Discharge: 2018-08-28 | Disposition: A | Discharge status: Home / Self Care | Payer: Medicaid Other | Source: Ambulatory Visit | Attending: Obstetrics and Gynecology | Admitting: Obstetrics and Gynecology

## 2018-08-28 ENCOUNTER — Ambulatory Visit
Admission: RE | Admit: 2018-08-28 | Discharge: 2018-08-28 | Disposition: A | Discharge status: Home / Self Care | Payer: PRIVATE HEALTH INSURANCE | Source: Ambulatory Visit | Attending: Obstetrics and Gynecology | Admitting: Obstetrics and Gynecology

## 2018-08-28 VITALS — BP 130/82 | Ht 64.0 in | Wt 222.0 lb

## 2018-08-28 DIAGNOSIS — N6320 Unspecified lump in the left breast, unspecified quadrant: Secondary | ICD-10-CM

## 2018-08-28 DIAGNOSIS — Z1239 Encounter for other screening for malignant neoplasm of breast: Secondary | ICD-10-CM

## 2018-08-28 DIAGNOSIS — N6325 Unspecified lump in the left breast, overlapping quadrants: Secondary | ICD-10-CM

## 2018-08-28 NOTE — Progress Notes (Signed)
Complaints of a left breast mobile lump x one month that is painful. Patient states the pain comes and goes. Patient rates the pain at a 1 out of 10.  Pap Smear: Pap smear not completed today. Last Pap smear was in March 2019 at Dr. Albertina Parr office and normal per patient. Per patient has no history of an abnormal Pap smear. No Pap smear results are in Epic.  Physical exam: Breasts Breasts symmetrical. No skin abnormalities bilateral breasts. No nipple retraction bilateral breasts. No nipple discharge bilateral breasts. No lymphadenopathy. No lumps palpated right breast. Palpated a mobile lump within the left breast at 3 o'clock 6 cm from the nipple. Complaints of tenderness when palpated lump within the left breast. Referred patient to the [facility] for a diagnostic mammogram and left breast ultrasound. Appointment scheduled for Thursday, August 28, 2018 at 1420.        Pelvic/Bimanual No Pap smear completed today since last Pap smear was in March 2019 per patient. Pap smear not indicated per BCCCP guidelines.   Smoking History: Patient has never smoked.  Patient Navigation: Patient education provided. Access to services provided for patient through B[facility]CP program.   Breast and Cervical Cancer Risk Assessment: Patient has no family history of breast cancer, known genetic mutations, or radiation treatment to the chest before age 39. Patient has no history of cervical dysplasia, immunocompromised, or DES exposure in-utero.  Risk Assessment    Risk Scores      08/28/2018   Last edited by: Priscille Heidelberg, RN   5-year risk: 0.7 %   Lifetime risk: 9.5 %

## 2018-08-28 NOTE — Patient Instructions (Addendum)
Explained breast self awareness with Wyn Quaker.  Patient did not need a Pap smear today due to last Pap smear was in March 2019 per patient. Let her know B[facility]CP will cover Pap smears every 3 years unless has a history of abnormal Pap smears. Referred patient to the [facility] for a diagnostic mammogram and left breast ultrasound. Appointment scheduled for Thursday, August 28, 2018 at 1420. Patient aware of appointment and will be there. Sherian Bigness verbalized understanding.  Brannock, Kathaleen Maser, RN 1:03 PM

## 2018-08-29 ENCOUNTER — Encounter (HOSPITAL_COMMUNITY): Payer: Self-pay | Admitting: *Deleted

## 2018-10-24 ENCOUNTER — Other Ambulatory Visit: Payer: Self-pay

## 2018-10-24 ENCOUNTER — Emergency Department (HOSPITAL_COMMUNITY)
Admission: EM | Admit: 2018-10-24 | Discharge: 2018-10-24 | Disposition: A | Discharge status: Home / Self Care | Payer: PRIVATE HEALTH INSURANCE | Attending: Emergency Medicine | Admitting: Emergency Medicine

## 2018-10-24 DIAGNOSIS — Z91010 Allergy to peanuts: Secondary | ICD-10-CM | POA: Insufficient documentation

## 2018-10-24 DIAGNOSIS — R112 Nausea with vomiting, unspecified: Secondary | ICD-10-CM | POA: Insufficient documentation

## 2018-10-24 DIAGNOSIS — I10 Essential (primary) hypertension: Secondary | ICD-10-CM | POA: Insufficient documentation

## 2018-10-24 DIAGNOSIS — Z79899 Other long term (current) drug therapy: Secondary | ICD-10-CM | POA: Insufficient documentation

## 2018-10-24 DIAGNOSIS — R197 Diarrhea, unspecified: Secondary | ICD-10-CM | POA: Insufficient documentation

## 2018-10-24 LAB — CBC WITH DIFFERENTIAL/PLATELET
ABS IMMATURE GRANULOCYTES: 0.02 10*3/uL (ref 0.00–0.07)
BASOS PCT: 0 %
Basophils Absolute: 0 10*3/uL (ref 0.0–0.1)
EOS ABS: 0.1 10*3/uL (ref 0.0–0.5)
Eosinophils Relative: 2 %
HCT: 37.6 % (ref 36.0–46.0)
Hemoglobin: 12.1 g/dL (ref 12.0–15.0)
Immature Granulocytes: 0 %
Lymphocytes Relative: 16 %
Lymphs Abs: 1 10*3/uL (ref 0.7–4.0)
MCH: 26.7 pg (ref 26.0–34.0)
MCHC: 32.2 g/dL (ref 30.0–36.0)
MCV: 82.8 fL (ref 80.0–100.0)
MONOS PCT: 9 %
Monocytes Absolute: 0.6 10*3/uL (ref 0.1–1.0)
NEUTROS PCT: 73 %
NRBC: 0 % (ref 0.0–0.2)
Neutro Abs: 4.8 10*3/uL (ref 1.7–7.7)
PLATELETS: 206 10*3/uL (ref 150–400)
RBC: 4.54 MIL/uL (ref 3.87–5.11)
RDW: 14.1 % (ref 11.5–15.5)
WBC: 6.5 10*3/uL (ref 4.0–10.5)

## 2018-10-24 LAB — COMPREHENSIVE METABOLIC PANEL
ALT: 30 U/L (ref 0–44)
AST: 34 U/L (ref 15–41)
Albumin: 3.5 g/dL (ref 3.5–5.0)
Alkaline Phosphatase: 61 U/L (ref 38–126)
Anion gap: 9 (ref 5–15)
BUN: 12 mg/dL (ref 6–20)
CHLORIDE: 106 mmol/L (ref 98–111)
CO2: 21 mmol/L — AB (ref 22–32)
Calcium: 8.7 mg/dL — ABNORMAL LOW (ref 8.9–10.3)
Creatinine, Ser: 1.17 mg/dL — ABNORMAL HIGH (ref 0.44–1.00)
GFR, EST NON AFRICAN AMERICAN: 57 mL/min — AB (ref >60)
Glucose, Bld: 112 mg/dL — ABNORMAL HIGH (ref 70–99)
Potassium: 4.1 mmol/L (ref 3.5–5.1)
SODIUM: 136 mmol/L (ref 135–145)
Total Bilirubin: 0.6 mg/dL (ref 0.3–1.2)
Total Protein: 7.1 g/dL (ref 6.5–8.1)

## 2018-10-24 LAB — URINALYSIS, ROUTINE W REFLEX MICROSCOPIC
Bilirubin Urine: NEGATIVE
GLUCOSE, UA: NEGATIVE mg/dL
KETONES UR: NEGATIVE mg/dL
Nitrite: NEGATIVE
PROTEIN: NEGATIVE mg/dL
Specific Gravity, Urine: 1.013 (ref 1.005–1.030)
pH: 5 (ref 5.0–8.0)

## 2018-10-24 LAB — LIPASE, BLOOD: LIPASE: 29 U/L (ref 11–51)

## 2018-10-24 LAB — I-STAT BETA HCG BLOOD, ED (MC, WL, AP ONLY): I-stat hCG, quantitative: <5 m[IU]/mL (ref <5)

## 2018-10-24 MED ORDER — ALUM & MAG HYDROXIDE-SIMETH 200-200-20 MG/5ML PO SUSP
15.0000 mL | Freq: Once | ORAL | Status: AC
  Administered 2018-10-24: 15 mL via ORAL
  Filled 2018-10-24: qty 30

## 2018-10-24 MED ORDER — ACETAMINOPHEN 325 MG PO TABS
650.0000 mg | ORAL_TABLET | Freq: Once | ORAL | Status: AC
  Administered 2018-10-24: 650 mg via ORAL
  Filled 2018-10-24: qty 2

## 2018-10-24 MED ORDER — ONDANSETRON HCL 4 MG/2ML IJ SOLN
4.0000 mg | Freq: Once | INTRAMUSCULAR | Status: AC
  Administered 2018-10-24: 4 mg via INTRAVENOUS
  Filled 2018-10-24: qty 2

## 2018-10-24 MED ORDER — MORPHINE SULFATE (PF) 2 MG/ML IV SOLN
2.0000 mg | Freq: Once | INTRAVENOUS | Status: AC
Start: 2018-10-24 — End: 2018-10-24
  Administered 2018-10-24: 2 mg via INTRAVENOUS
  Filled 2018-10-24: qty 1

## 2018-10-24 MED ORDER — FAMOTIDINE IN NACL 20-0.9 MG/50ML-% IV SOLN
20.0000 mg | Freq: Once | INTRAVENOUS | Status: AC
  Administered 2018-10-24: 20 mg via INTRAVENOUS
  Filled 2018-10-24: qty 50

## 2018-10-24 MED ORDER — MORPHINE SULFATE (PF) 2 MG/ML IV SOLN
2.0000 mg | Freq: Once | INTRAVENOUS | Status: AC
  Administered 2018-10-24: 2 mg via INTRAVENOUS
  Filled 2018-10-24: qty 1

## 2018-10-24 MED ORDER — ONDANSETRON 4 MG PO TBDP: 4.0000 mg | ORAL_TABLET | Freq: Three times a day (TID) | ORAL | 0 refills | Status: DC | PRN

## 2018-10-24 MED ORDER — SODIUM CHLORIDE 0.9 % IV BOLUS
1000.0000 mL | Freq: Once | INTRAVENOUS | Status: AC
  Administered 2018-10-24: 1000 mL via INTRAVENOUS

## 2018-10-24 NOTE — ED Provider Notes (Signed)
[name] EMERGENCY DEPARTMENT Provider Note   CSN: [id number] Arrival date & time: 10/24/18  2952    History   Chief Complaint Chief Complaint  Patient presents with  . Nausea  . Emesis  . Diarrhea    HPI Sabrina Bond is a 43 y.o. female presenting to the emergency department with complaint of 3 days of nausea, vomiting, diarrhea.  Patient states her daughter had vomiting and diarrhea prior to this, however this lasted 24 hours and she feels well now.  Patient states her symptoms have been persisting.  She has about 3 episodes of nonbloody nonbilious emesis per day.  No coffee ground emesis.  She has about 2 episodes of watery diarrhea per day.  Stool is nonbloody.  She has developed an epigastric abdominal pain is constant and feels like a "knot."  No interventions tried prior to arrival.  Reports T-max of 99.4 F.  No urinary symptoms or pelvic complaints.  Denies heartburn or chest pain.  Past abdominal surgeries include cholecystectomy.  States she has been diagnosed with a peptic ulcer in the past.  No recent out of country travel.  No drinking from outside water sources.     The history is provided by the patient.    Past Medical History:  Diagnosis Date  . Arthritis   . Diverticulitis   . Hypertension     Patient Active Problem List   Diagnosis Date Noted  . 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 CHOLECYSTECTOMY    . ENDOMETRIAL ABLATION    . TUBAL LIGATION       OB History    Gravida  5   Para      Term      Preterm      AB  1   Living  4     SAB  1   TAB      Ectopic      Multiple      Live Births  4            Home Medications    Prior to Admission medications   Medication Sig Start Date End Date Taking? Authorizing Provider  famotidine (PEPCID) 20 MG tablet Take 20 mg by mouth as needed for heartburn or indigestion.   Yes [provider]  omeprazole (PRILOSEC) 20  MG capsule Take 20 mg by mouth as needed (acd reflux).   Yes [provider]  cyclobenzaprine (FLEXERIL) 10 MG tablet Take 1 tablet (10 mg total) by mouth 3 (three) times daily as needed for muscle spasms. Patient not taking: Reported on 03/20/2018 07/24/17   Cathren Laine, MD  ferrous sulfate 325 (65 FE) MG tablet Take 1 tablet (325 mg total) by mouth daily. Patient not taking: Reported on 08/28/2018 03/20/18   Liberty Handy, PA-C  fluticasone [facility]) 50 MCG/ACT nasal spray Place 1 spray into both nostrils daily. Patient not taking: Reported on 10/24/2015 05/01/15   Mancel Bale, MD  ibuprofen (ADVIL,MOTRIN) 800 MG tablet Take 1 tablet (800 mg total) by mouth 3 (three) times daily. Patient not taking: Reported on 03/20/2018 04/10/16   Sam, Ace Gins, PA-C  meclizine (ANTIVERT) 25 MG tablet Take 1 tablet (25 mg total) by mouth 3 (three) times daily as needed for dizziness. Patient not taking: Reported on 03/20/2018 10/24/15   Pricilla Loveless, MD  methocarbamol (ROBAXIN) 500 MG tablet Take 1 tablet (500 mg total) by mouth 2 (two) times daily. Patient  not taking: Reported on 03/20/2018 04/10/16   Sam, Ace Gins, PA-C  ondansetron (ZOFRAN ODT) 4 MG disintegrating tablet Take 1 tablet (4 mg total) by mouth every 8 (eight) hours as needed for nausea or vomiting. 10/24/18   Robinson, [place] N, PA-C  sucralfate (CARAFATE) 1 GM/10ML suspension Take 10 mLs (1 g total) by mouth 4 (four) times daily -  with meals and at bedtime. Patient not taking: Reported on 08/28/2018 03/20/18   Liberty Handy, PA-C  traMADol (ULTRAM) 50 MG tablet Take 1 tablet (50 mg total) by mouth every 6 (six) hours as needed. Patient not taking: Reported on 03/20/2018 07/24/17   Cathren Laine, MD    Family History Family History  Problem Relation Age of Onset  . Hypertension Mother   . Hypertension Father   . Diabetes Father     Social History Social History   Tobacco Use  . Smoking status: Never Smoker  . Smokeless  tobacco: Never Used  Substance Use Topics  . Alcohol use: No  . Drug use: No     Allergies   Kiwi extract; Metoprolol; Benicar [olmesartan]; Peanut-containing drug products; and Naproxen   Review of Systems Review of Systems  Gastrointestinal: Positive for abdominal pain, diarrhea, nausea and vomiting.  All other systems reviewed and are negative.    Physical Exam Updated Vital Signs BP (!) 147/78 (BP Location: Left Arm)   Pulse 60   Temp 98.3 F (36.8 C) (Oral)   Resp 16   Ht 5\' 4"  (1.626 m)   Wt 99.8 kg   SpO2 100%   BMI 37.76 kg/m   Physical Exam Vitals signs and nursing note reviewed.  Constitutional:      General: She is not in acute distress.    Appearance: She is well-developed. She is obese. She is not ill-appearing.  HENT:     Head: Normocephalic and atraumatic.     Mouth/Throat:     Mouth: Mucous membranes are moist.  Eyes:     Conjunctiva/sclera: Conjunctivae normal.  Cardiovascular:     Rate and Rhythm: Normal rate and regular rhythm.     Heart sounds: Normal heart sounds.  Pulmonary:     Effort: Pulmonary effort is normal. No respiratory distress.     Breath sounds: Normal breath sounds.  Abdominal:     General: Bowel sounds are normal. There is no distension.     Palpations: Abdomen is soft. There is no mass.     Tenderness: There is abdominal tenderness in the epigastric area. There is no guarding or rebound.  Skin:    General: Skin is warm.  Neurological:     Mental Status: She is alert.  Psychiatric:        Behavior: Behavior normal.      ED Treatments / Results  Labs (all labs ordered are listed, but only abnormal results are displayed) Labs Reviewed  URINALYSIS, ROUTINE W REFLEX MICROSCOPIC - Abnormal; Notable for the following components:      Result Value   APPearance HAZY (*)    Hgb urine dipstick SMALL (*)    Leukocytes,Ua MODERATE (*)    Bacteria, UA FEW (*)    All other components within normal limits  COMPREHENSIVE  METABOLIC PANEL - Abnormal; Notable for the following components:   CO2 21 (*)    Glucose, Bld 112 (*)    Creatinine, Ser 1.17 (*)    Calcium 8.7 (*)    GFR calc non Af Amer 57 (*)  All other components within normal limits  CBC WITH DIFFERENTIAL/PLATELET  LIPASE, BLOOD  I-STAT BETA HCG BLOOD, ED ([facility], [facility], [facility] ONLY)    EKG None  Radiology No results found.  Procedures Procedures (including critical care time)  Medications Ordered in ED Medications  ondansetron (ZOFRAN) injection 4 mg (4 mg Intravenous Given 10/24/18 1018)  sodium chloride 0.9 % bolus 1,000 mL (0 mLs Intravenous Stopped 10/24/18 1144)  famotidine (PEPCID) IVPB 20 mg premix (0 mg Intravenous Stopped 10/24/18 1049)  morphine 2 MG/ML injection 2 mg (2 mg Intravenous Given 10/24/18 1018)  acetaminophen (TYLENOL) tablet 650 mg (650 mg Oral Given 10/24/18 1225)  alum & mag hydroxide-simeth (MAALOX/MYLANTA) 200-200-20 MG/5ML suspension 15 mL (15 mLs Oral Given 10/24/18 1225)  morphine 2 MG/ML injection 2 mg (2 mg Intravenous Given 10/24/18 1231)     Initial Impression / Assessment and Plan / ED Course  I have reviewed the triage vital signs and the nursing notes.  Pertinent labs & imaging results that were available during my care of the patient were reviewed by me and considered in my medical decision making (see chart for details).  Clinical Course as of Oct 24 1452  Fri Oct 24, 2018  1225 Patient reevaluated, reports interval improvement in symptoms, however pain in epigastrium returned.  She states it radiates to her back.  Lipase within normal limits.  Labs are all reassuring.  Status post cholecystectomy.  Will re-dose with medications and reevaluate.   [JR]  1411 Patient reevaluated, reports significant improvement in symptoms.  Patient states she is feeling well and ready for discharge.  Will discharge with symptomatic management and PCP follow-up.   [JR]    Clinical Course User Index [JR] Robinson, [place] N,  PA-C       Patient with symptoms consistent with viral gastroenteritis. Daughter with similar symptoms. Vitals are stable, no fever.  No signs of dehydration, tolerating PO fluids > 6 oz.  Lungs are clear. Epigastric abdominal pain, improved with interventions. Labs are reassuring. No concern for appendicitis,  pancreatitis, ruptured viscus, UTI, kidney stone, or any other abdominal etiology.  Supportive therapy indicated with return if symptoms worsen.  Patient counseled.  Discussed results, findings, treatment and follow up. Patient advised of return precautions. Patient verbalized understanding and agreed with plan.  Final Clinical Impressions(s) / ED Diagnoses   Final diagnoses:  Nausea vomiting and diarrhea    ED Discharge Orders         Ordered    ondansetron (ZOFRAN ODT) 4 MG disintegrating tablet  Every 8 hours PRN     10/24/18 1413           Robinson, [place] N, [place] 10/24/18 1454    Pricilla Loveless, MD 10/25/18 [phone]

## 2018-10-24 NOTE — ED Notes (Signed)
Pt verbalized understanding of d/c instructions and has no further questions, VSS, NAD. Pt d/c home with family driving.  

## 2018-10-24 NOTE — Discharge Instructions (Signed)
Please read instructions below. Drink clear liquids until your stomach feels better. Then, slowly introduce bland foods into your diet as tolerated, such as bread, rice, apples, bananas. You can take zofran every 8 hours as needed for nausea. Take pepcid daily to help with stomach discomfort. You can also use mylanta or pepto bismol for help with discomfort. Follow up with your primary care if symptoms persist. Return to the ER for severe abdominal pain, fever, uncontrollable vomiting, or new or concerning symptoms.

## 2018-10-24 NOTE — ED Notes (Signed)
Pt states that abd pain and back pain are coming back now.

## 2018-10-24 NOTE — ED Triage Notes (Signed)
Pt reports nausea, vomiting, diarrhea with a fever at home for the last three days. Pt reports abdominal pain that radiates into her back. Pt denies any blood in her stool or vomit. Pt reports the highest temperature at home was 99.4.

## 2018-10-24 NOTE — ED Notes (Signed)
Pt moved to progression bed

## 2019-03-30 ENCOUNTER — Other Ambulatory Visit: Payer: Self-pay

## 2019-03-30 ENCOUNTER — Encounter (HOSPITAL_COMMUNITY): Payer: Self-pay | Admitting: Emergency Medicine

## 2019-03-30 ENCOUNTER — Emergency Department (HOSPITAL_COMMUNITY)
Admission: EM | Admit: 2019-03-30 | Discharge: 2019-03-30 | Disposition: A | Discharge status: Home / Self Care | Payer: Medicaid Other | Attending: Emergency Medicine | Admitting: Emergency Medicine

## 2019-03-30 DIAGNOSIS — Z79899 Other long term (current) drug therapy: Secondary | ICD-10-CM | POA: Insufficient documentation

## 2019-03-30 DIAGNOSIS — Z8679 Personal history of other diseases of the circulatory system: Secondary | ICD-10-CM

## 2019-03-30 DIAGNOSIS — M456 Ankylosing spondylitis lumbar region: Secondary | ICD-10-CM | POA: Insufficient documentation

## 2019-03-30 DIAGNOSIS — I10 Essential (primary) hypertension: Secondary | ICD-10-CM | POA: Insufficient documentation

## 2019-03-30 DIAGNOSIS — Z760 Encounter for issue of repeat prescription: Secondary | ICD-10-CM

## 2019-03-30 DIAGNOSIS — M546 Pain in thoracic spine: Secondary | ICD-10-CM

## 2019-03-30 DIAGNOSIS — R51 Headache: Secondary | ICD-10-CM | POA: Insufficient documentation

## 2019-03-30 DIAGNOSIS — M542 Cervicalgia: Secondary | ICD-10-CM | POA: Insufficient documentation

## 2019-03-30 MED ORDER — DICLOFENAC SODIUM 1 % TD GEL: 2.0000 g | Freq: Four times a day (QID) | TRANSDERMAL | 0 refills | Status: DC

## 2019-03-30 MED ORDER — LOSARTAN POTASSIUM 25 MG PO TABS: 25.0000 mg | ORAL_TABLET | Freq: Every day | ORAL | 0 refills | Status: AC

## 2019-03-30 MED ORDER — CYCLOBENZAPRINE HCL 10 MG PO TABS: 10.0000 mg | ORAL_TABLET | Freq: Three times a day (TID) | ORAL | 0 refills | Status: AC

## 2019-03-30 NOTE — ED Provider Notes (Signed)
[name]   CSN: [id number] Arrival date & time: 03/30/19  [id number]    History   Chief Complaint Chief Complaint  Patient presents with  . Back Pain  . Migraine    HPI Sabrina Bond is a 43 y.o. female with history of HTN is here for evaluation of back pain.  Onset 6 days ago.  It is located to the right thoracic back with some radiation into the right abdomen, right neck.  Described as a cramp.  It is constant and mild but worse with any movement, bending at the waist, twisting and palpation.  She has taken some BC powder and arthritis OTC medicines without significant pain.  2 days after the back pain started she developed a "kink" on the right side of her neck that was painful with movement and palpation.  She has been using heat to the area and the neck tightness has improved.  Other associate symptoms include right-sided headache at the base of her right neck, nausea.  States in the past she has had lumbar strains that have felt similar.  She is on her feet for 10 hours during her shifts and her back is more sore at the end of her shift.  She denies any back trauma, exertional activity, falls.  She denies any radiation of pain in the chest, shortness of breath, distal paresthesias, numbness or weakness.  No vomiting, diarrhea, constipation.  No history of kidney stones, dysuria, hematuria.  Status post cholecystectomy.     HPI  Past Medical History:  Diagnosis Date  . Arthritis   . Diverticulitis   . Hypertension     Patient Active Problem List   Diagnosis Date Noted  . 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 CHOLECYSTECTOMY    . ENDOMETRIAL ABLATION    . TUBAL LIGATION       OB History    Gravida  5   Para      Term      Preterm      AB  1   Living  4     SAB  1   TAB      Ectopic      Multiple      Live Births  4            Home Medications    Prior to  Admission medications   Medication Sig Start Date End Date Taking? Authorizing Provider  cyclobenzaprine (FLEXERIL) 10 MG tablet Take 1 tablet (10 mg total) by mouth 3 (three) times daily for 5 days. 03/30/19 04/04/19  Liberty HandyGibbons, Nilan Iddings J, PA-C  diclofenac sodium (VOLTAREN) 1 % GEL Apply 2 g topically 4 (four) times daily. 03/30/19   Liberty HandyGibbons, Leanord Thibeau J, PA-C  famotidine (PEPCID) 20 MG tablet Take 20 mg by mouth as needed for heartburn or indigestion.    [provider]  ferrous sulfate 325 (65 FE) MG tablet Take 1 tablet (325 mg total) by mouth daily. Patient not taking: Reported on 08/28/2018 03/20/18   Liberty HandyGibbons, Aunisty Reali J, PA-C  fluticasone [facility](FLONASE) 50 MCG/ACT nasal spray Place 1 spray into both nostrils daily. Patient not taking: Reported on 10/24/2015 05/01/15   Mancel BaleWentz, Elliott, MD  ibuprofen (ADVIL,MOTRIN) 800 MG tablet Take 1 tablet (800 mg total) by mouth 3 (three) times daily. Patient not taking: Reported on 03/20/2018 04/10/16   Carlene CoriaSam, Serena Y, PA-C  losartan (COZAAR) 25 MG tablet Take 1 tablet (25  mg total) by mouth daily. 03/30/19 04/29/19  Kinnie Feil, PA-C  meclizine (ANTIVERT) 25 MG tablet Take 1 tablet (25 mg total) by mouth 3 (three) times daily as needed for dizziness. Patient not taking: Reported on 03/20/2018 10/24/15   Sherwood Gambler, MD  methocarbamol (ROBAXIN) 500 MG tablet Take 1 tablet (500 mg total) by mouth 2 (two) times daily. Patient not taking: Reported on 03/20/2018 04/10/16   Sam, Olivia Canter, PA-C  omeprazole (PRILOSEC) 20 MG capsule Take 20 mg by mouth as needed (acd reflux).    [provider]  ondansetron (ZOFRAN ODT) 4 MG disintegrating tablet Take 1 tablet (4 mg total) by mouth every 8 (eight) hours as needed for nausea or vomiting. 10/24/18   Robinson, [place] N, PA-C  sucralfate (CARAFATE) 1 GM/10ML suspension Take 10 mLs (1 g total) by mouth 4 (four) times daily -  with meals and at bedtime. Patient not taking: Reported on 08/28/2018 03/20/18   Kinnie Feil,  PA-C  traMADol (ULTRAM) 50 MG tablet Take 1 tablet (50 mg total) by mouth every 6 (six) hours as needed. Patient not taking: Reported on 03/20/2018 07/24/17   Lajean Saver, MD    Family History Family History  Problem Relation Age of Onset  . Hypertension Mother   . Hypertension Father   . Diabetes Father     Social History Social History   Tobacco Use  . Smoking status: Never Smoker  . Smokeless tobacco: Never Used  Substance Use Topics  . Alcohol use: No  . Drug use: No     Allergies   Kiwi extract, Metoprolol, Benicar [olmesartan], Peanut-containing drug products, and Naproxen   Review of Systems Review of Systems  Gastrointestinal: Positive for abdominal pain.  Musculoskeletal: Positive for back pain and neck pain.  Neurological: Positive for headaches.  All other systems reviewed and are negative.    Physical Exam Updated Vital Signs BP (!) 141/65 (BP Location: Right Arm)   Pulse 65   Temp 98.1 F (36.7 C) (Oral)   Resp 14   SpO2 100%   Physical Exam Vitals signs and nursing note reviewed.  Constitutional:      Appearance: She is well-developed.     Comments: Non toxic in NAD  HENT:     Head: Normocephalic and atraumatic.     Nose: Nose normal.  Eyes:     Conjunctiva/sclera: Conjunctivae normal.  Neck:     Musculoskeletal: Normal range of motion. Muscular tenderness present.     Comments: C-spine: Mild tenderness to the right base of the skull occiput, upper/middle trapezius.  Pain is reproduced with neck bend and rotation.  No midline C-spine tenderness.  No meningismus.  Trachea is midline.  No lateral or anterior neck tenderness. Cardiovascular:     Rate and Rhythm: Normal rate and regular rhythm.     Comments: 1+ radial and DP pulses bilaterally Pulmonary:     Effort: Pulmonary effort is normal.     Breath sounds: Normal breath sounds.  Abdominal:     General: Bowel sounds are normal.     Palpations: Abdomen is soft.     Tenderness: There  is no abdominal tenderness.     Comments: No G/R/R. No suprapubic or CVA tenderness. Negative Murphy's and McBurney's. Active BS to lower quadrants.   Musculoskeletal: Normal range of motion.        General: Tenderness present.     Comments: T-spine: Diffuse lateral muscular tenderness extending slightly to the lateral posterior abdomen.  Pain is elicited with sitting up on the bed for exam, trunk movements.  Reproducible thoracic back "pinch" with right SLR but no low back pain or paresthesias reported.  No midline tenderness. L-spine: No midline or paraspinal muscular tenderness.  Full passive ROM of bilateral hips without pain.   Skin:    General: Skin is warm and dry.     Capillary Refill: Capillary refill takes less than 2 seconds.     Comments: Skin is normal to the thoracic and lumbar spine, anterior abdomen  Neurological:     Mental Status: She is alert.     Comments: Sensation and strength is intact in upper and lower extremities  Psychiatric:        Behavior: Behavior normal.      ED Treatments / Results  Labs (all labs ordered are listed, but only abnormal results are displayed) Labs Reviewed - No data to display  EKG None  Radiology No results found.  Procedures Procedures (including critical care time)  Medications Ordered in ED Medications - No data to display   Initial Impression / Assessment and Plan / ED Course  I have reviewed the triage vital signs and the nursing notes.  Pertinent labs & imaging results that were available during my care of the patient were reviewed by me and considered in my medical decision making (see chart for details).  Highest on DDX is muscular strain versus spasm.  Her MSK exam shows easily reproducible tenderness throughout her right-sided paraspinous thoracic and cervical muscles, pain with active movement.    I did consider kidney stone given the location of her thoracic/flank pain however given MSK exam her symptoms are  most suggestive of muscular etiology and less likely kidney stone.  She has no history of kidney stones, reported hematuria.  She has very lateral thoracic muscular tenderness but no anterior abd tenderness. S/p cholecystectomy. Also, she is having neck pain described as a kink and right-sided headache which is last classic of a kidney stone.  In the past she has had similar pain in lumbar sprain dx as strain.  She is on her feet at work 10+ which makes pain worse more suggestive of MSK etiology. She denies UTI symptoms and again, neck pain and headache not consistent with pyelonephritis.  She has no pulse or neuro deficits distally.  No cauda equina symptoms.  Overall picture is not consistent with pyelonephritis, kidney stone, cauda equina, epidural abscess, dissection.  I do not think emergent imaging or lab work is indicated today.    I did explain to patient that symptoms likely MSK and less likely kidney etiology. I explained and educated pt on symptoms that would suggest other pathology. We will discharge her with NSAID, flexeril, voltaren gel, massage, heat.  Discussed specific s/s that would warrant return to ER for evaluation. Pt was comfortable with this plan in agreement.   Pt asked for refills for BP meds, done.    Final Clinical Impressions(s) / ED Diagnoses   Final diagnoses:  Acute right-sided thoracic back pain  Neck pain on right side  History of hypertension  Medication refill    ED Discharge Orders         Ordered    cyclobenzaprine (FLEXERIL) 10 MG tablet  3 times daily     03/30/19 1045    diclofenac sodium (VOLTAREN) 1 % GEL  4 times daily     03/30/19 1048    losartan (COZAAR) 25 MG tablet  Daily  03/30/19 1048           Liberty HandyGibbons, Rutilio Yellowhair J, PA-C 03/30/19 1102    Melene PlanFloyd, Dan, DO 03/30/19 1150

## 2019-03-30 NOTE — ED Triage Notes (Addendum)
Rt sided neck and back pain  Since last Tuesday , denies injury ,states migrain that comes and goes, states neck pain causes it

## 2019-03-30 NOTE — Discharge Instructions (Addendum)
You were seen in the ER for right-sided back pain, neck pain and headache.  Given your symptoms and exam I suspect this is a muscular overuse injury possibly spasm.  You have no other symptoms to suggest a kidney stone and you have no history of this.  Take 500 to 1000 mg of Tylenol every 6 hours.  You can add 400 mg of ibuprofen every 6 hours as well for more pain control.  I have given you prescription for muscle relaxer to take over the next 2 to 3 days.  Apply Voltaren gel and massage the area.  You can also use a heating pad.  Return to the ER if you develop fever greater than 100, nausea, vomiting, blood in your urine, burning with urination, difficulty urinating, worsening or localized abdominal pain.

## 2019-03-30 NOTE — ED Notes (Signed)
Pain generates in lower right lumbar region and radiates around right side and up to right breast.

## 2019-03-30 NOTE — ED Notes (Signed)
Patient verbalizes understanding of discharge instructions. Opportunity for questioning and answers were provided. Armband removed by staff, pt discharged from ED.  

## 2019-03-30 NOTE — ED Notes (Signed)
ED Provider at bedside. 

## 2019-05-28 ENCOUNTER — Ambulatory Visit (INDEPENDENT_AMBULATORY_CARE_PROVIDER_SITE_OTHER): Payer: Self-pay | Admitting: Family Medicine

## 2019-05-28 ENCOUNTER — Other Ambulatory Visit: Payer: Self-pay

## 2019-05-28 ENCOUNTER — Encounter: Payer: Self-pay | Admitting: Family Medicine

## 2019-05-28 DIAGNOSIS — M542 Cervicalgia: Secondary | ICD-10-CM

## 2019-05-28 DIAGNOSIS — I10 Essential (primary) hypertension: Secondary | ICD-10-CM | POA: Insufficient documentation

## 2019-05-28 DIAGNOSIS — M1990 Unspecified osteoarthritis, unspecified site: Secondary | ICD-10-CM | POA: Insufficient documentation

## 2019-05-28 DIAGNOSIS — M5441 Lumbago with sciatica, right side: Secondary | ICD-10-CM

## 2019-05-28 DIAGNOSIS — G8929 Other chronic pain: Secondary | ICD-10-CM

## 2019-05-28 MED ORDER — VITAMIN D-3 125 MCG (5000 UT) PO TABS: 1.0000 | ORAL_TABLET | Freq: Every day | ORAL | 3 refills | Status: AC

## 2019-05-28 MED ORDER — MELOXICAM 15 MG PO TABS: 7.5000 mg | ORAL_TABLET | Freq: Every day | ORAL | 6 refills | Status: DC | PRN

## 2019-05-28 MED ORDER — BACLOFEN 10 MG PO TABS: 5.0000 mg | ORAL_TABLET | Freq: Three times a day (TID) | ORAL | 1 refills | Status: DC | PRN

## 2019-05-28 MED ORDER — TRAMADOL HCL 50 MG PO TABS: 50.0000 mg | ORAL_TABLET | Freq: Every evening | ORAL | 0 refills | Status: DC | PRN

## 2019-05-28 NOTE — Progress Notes (Signed)
Office Visit Note   Patient: Sabrina Bond           Date of Birth: 10/18/1975           MRN: [id number] Visit Date: 05/28/2019 Requested by: [facility] [address] [place],  [place] [address] PCP: [facility]  Subjective: Chief Complaint  Patient presents with  . Lower Back - Pain    Pain in the right side of lower back, radiating down the leg to the foot. Numbness in 5th toe. H/o lumbar strain last year. Hurts to stand long time.  . Neck - Pain    Stiffness in right side of neck x 3 weeks, with weakness in the right arm. Right-hand dominant.    HPI: She is here with low back pain.  She has neck pain as well.  Initially had a strain injury 7 or 8 months ago.  She injured a year and a couple months after that.  She has never fully recovered from the second injury.  Now she is having pain radiating down her right leg with numbness in her fifth toe the longer she is on her feet.  Pain also radiates up her back toward her neck and it hurts to turn her neck to the left.  She has not sought treatment other than a visit to the ER, due to lack of insurance.  Denies bowel or bladder dysfunction, denies fevers or chills.              ROS:   All other systems were reviewed and are negative.  Objective: Vital Signs: There were no vitals taken for this visit.  Physical Exam:  General:  Alert and oriented, in no acute distress. Pulm:  Breathing unlabored. Psy:  Normal mood, congruent affect. Skin: No visible rash. Neck: Good range of motion but pain with rotation to the left.  Spurling's test is negative.  She has 5/5 upper extremity strength and 2+ DTRs.  Tender trigger points in the right rhomboid area.  Also tender in the right trapezius belly. Low back: Very tender in the midline over the L4-5 level.  Also tender in the right sciatic notch.  Straight leg raise negative, lower extremity strength and reflexes are normal.  Imaging: None today.  Assessment & Plan: 1.  Chronic  neck and low back pain, possibly myofascial but she is having right-sided S1 radicular symptoms as well. -We will try home exercises.  I called in anti-inflammatory, muscle relaxant and tramadol to take as needed.  If she fails to improve, could refer her to physical therapy if she has health insurance by then.  MRI scan would be another consideration.     Procedures: No procedures performed  No notes on file     PMFS History: Patient Active Problem List   Diagnosis Date Noted  . Arthritis 05/28/2019  . HTN (hypertension) 05/28/2019  . Stuttering 04/30/2014   Past Medical History:  Diagnosis Date  . Arthritis   . Diverticulitis   . Hypertension     Family History  Problem Relation Age of Onset  . Hypertension Mother   . Hypertension Father   . Diabetes Father     Past Surgical History:  Procedure Laterality Date  . CHOLECYSTECTOMY    . ENDOMETRIAL ABLATION    . TUBAL LIGATION     Social History   Occupational History  . Not on file  Tobacco Use  . Smoking status: Never Smoker  . Smokeless tobacco: Never Used  Substance and Sexual Activity  . Alcohol use: No  . Drug use: No  . Sexual activity: Yes    Birth control/protection: None

## 2021-04-25 ENCOUNTER — Emergency Department: Payer: BC Managed Care – PPO

## 2021-04-25 ENCOUNTER — Other Ambulatory Visit: Payer: Self-pay

## 2021-04-25 ENCOUNTER — Encounter: Payer: Self-pay | Admitting: Emergency Medicine

## 2021-04-25 ENCOUNTER — Emergency Department
Admission: EM | Admit: 2021-04-25 | Discharge: 2021-04-25 | Disposition: A | Discharge status: Home / Self Care | Payer: BC Managed Care – PPO | Attending: Student in an Organized Health Care Education/Training Program | Admitting: Student in an Organized Health Care Education/Training Program

## 2021-04-25 DIAGNOSIS — Z91010 Allergy to peanuts: Secondary | ICD-10-CM | POA: Diagnosis not present

## 2021-04-25 DIAGNOSIS — Z20822 Contact with and (suspected) exposure to covid-19: Secondary | ICD-10-CM | POA: Insufficient documentation

## 2021-04-25 DIAGNOSIS — Z79899 Other long term (current) drug therapy: Secondary | ICD-10-CM | POA: Diagnosis not present

## 2021-04-25 DIAGNOSIS — J069 Acute upper respiratory infection, unspecified: Secondary | ICD-10-CM | POA: Insufficient documentation

## 2021-04-25 DIAGNOSIS — I10 Essential (primary) hypertension: Secondary | ICD-10-CM | POA: Diagnosis not present

## 2021-04-25 DIAGNOSIS — R059 Cough, unspecified: Secondary | ICD-10-CM | POA: Diagnosis present

## 2021-04-25 LAB — RESP PANEL BY RT-PCR (FLU A&B, COVID) ARPGX2
Influenza A by PCR: NEGATIVE
Influenza B by PCR: NEGATIVE
SARS Coronavirus 2 by RT PCR: NEGATIVE

## 2021-04-25 LAB — GROUP A STREP BY PCR: Group A Strep by PCR: NOT DETECTED

## 2021-04-25 MED ORDER — AMOXICILLIN 875 MG PO TABS: 875.0000 mg | ORAL_TABLET | Freq: Two times a day (BID) | ORAL | 0 refills | Status: DC

## 2021-04-25 MED ORDER — PREDNISONE 10 MG PO TABS: 30.0000 mg | ORAL_TABLET | Freq: Every day | ORAL | 0 refills | Status: DC

## 2021-04-25 MED ORDER — BENZONATATE 100 MG PO CAPS: 100.0000 mg | ORAL_CAPSULE | Freq: Four times a day (QID) | ORAL | 0 refills | Status: AC | PRN

## 2021-04-25 NOTE — ED Provider Notes (Signed)
[facility] Emergency Department Provider Note  ____________________________________________   Event Date/Time   First MD Initiated Contact with Patient 04/25/21 [phone]     (approximate)  I have reviewed the triage vital signs and the nursing notes.   HISTORY  Chief Complaint Cough    HPI Sabrina Bond is a 45 y.o. female presents emergency department complaining of cough and congestion with green sputum for at least 1 week.  Granddaughter is also sick.  She denies any fever or chills.  No chest pain or shortness of breath.  Past Medical History:  Diagnosis Date   Arthritis    Diverticulitis    Hypertension     Patient Active Problem List   Diagnosis Date Noted   Arthritis 05/28/2019   HTN (hypertension) 05/28/2019   Stuttering 04/30/2014    Past Surgical History:  Procedure Laterality Date   CHOLECYSTECTOMY     ENDOMETRIAL ABLATION     TUBAL LIGATION      Prior to Admission medications   Medication Sig Start Date End Date Taking? Authorizing Provider  benzonatate (TESSALON PERLES) 100 MG capsule Take 1 capsule (100 mg total) by mouth every 6 (six) hours as needed for cough. 04/25/21 04/25/22 Yes Josephanthony Tindel, Roselyn Bering, PA-C  predniSONE (DELTASONE) 10 MG tablet Take 3 tablets (30 mg total) by mouth daily with breakfast. 04/25/21  Yes Derrek Puff, Roselyn Bering, PA-C  amoxicillin (AMOXIL) 875 MG tablet Take 1 tablet (875 mg total) by mouth 2 (two) times daily. 04/25/21   Terrel Manalo, Roselyn Bering, PA-C  Cholecalciferol (VITAMIN D-3) 125 MCG (5000 UT) TABS Take 1 tablet by mouth daily. 05/28/19   Hilts, Casimiro Needle, MD  famotidine (PEPCID) 20 MG tablet Take 20 mg by mouth as needed for heartburn or indigestion.    [provider]  losartan (COZAAR) 25 MG tablet Take 1 tablet (25 mg total) by mouth daily. 03/30/19 04/29/19  Liberty Handy, PA-C  meloxicam (MOBIC) 15 MG tablet Take 0.5-1 tablets (7.5-15 mg total) by mouth daily as needed for pain. 05/28/19   Hilts, Casimiro Needle, MD   omeprazole (PRILOSEC) 20 MG capsule Take 20 mg by mouth as needed (acd reflux).    [provider]    Allergies Kiwi extract, Metoprolol, Benicar [olmesartan], Peanut-containing drug products, and Naproxen  Family History  Problem Relation Age of Onset   Hypertension Mother    Hypertension Father    Diabetes Father     Social History Social History   Tobacco Use   Smoking status: Never   Smokeless tobacco: Never  Vaping Use   Vaping Use: Never used  Substance Use Topics   Alcohol use: No   Drug use: No    Review of Systems  Constitutional: No fever/chills Eyes: No visual changes. ENT: No sore throat. Respiratory: Positive cough Cardiovascular: Denies chest pain Gastrointestinal: Denies abdominal pain Genitourinary: Negative for dysuria. Musculoskeletal: Negative for back pain. Skin: Negative for rash. Psychiatric: no mood changes,     ____________________________________________   PHYSICAL EXAM:  VITAL SIGNS: ED Triage Vitals  Enc Vitals Group     BP 04/25/21 0446 (!) 159/78     Pulse Rate 04/25/21 0446 72     Resp 04/25/21 0446 20     Temp 04/25/21 0446 98.5 F (36.9 C)     Temp Source 04/25/21 0446 Oral     SpO2 04/25/21 0446 99 %     Weight 04/25/21 0442 240 lb (108.9 kg)     Height 04/25/21 0442 5\' 4"  (1.626  m)     Head Circumference --      Peak Flow --      Pain Score 04/25/21 0442 0     Pain Loc --      Pain Edu? --      Excl. in GC? --     Constitutional: Alert and oriented. Well appearing and in no acute distress. Eyes: Conjunctivae are normal.  Head: Atraumatic. Nose: No congestion/rhinnorhea. Mouth/Throat: Mucous membranes are moist.   Neck:  supple no lymphadenopathy noted Cardiovascular: Normal rate, regular rhythm. Heart sounds are normal GU: deferred Musculoskeletal: FROM all extremities, warm and well perfused Neurologic:  Normal speech and language.  Skin:  Skin is warm, dry and intact. No rash  noted. Psychiatric: Mood and affect are normal. Speech and behavior are normal.  ____________________________________________   LABS (all labs ordered are listed, but only abnormal results are displayed)  Labs Reviewed  GROUP A STREP BY PCR  RESP PANEL BY RT-PCR (FLU A&B, COVID) [id number]   ____________________________________________   ____________________________________________  RADIOLOGY  Chest x-ray  ____________________________________________   PROCEDURES  Procedure(s) performed: No  Procedures    ____________________________________________   INITIAL IMPRESSION / ASSESSMENT AND PLAN / ED COURSE  Pertinent labs & imaging results that were available during my care of the patient were reviewed by me and considered in my medical decision making (see chart for details).   Patient is a 45 year old female presents emergency department with URI symptoms.  See HPI.  Physical exam shows patient per stable  Respiratory panel, strep test, are both negative  Chest x-ray reviewed by me confirmed by radiology to be negative for pneumonia  Did explain all the findings to the patient.  She is to follow with regular doctor if not improved in 3 days.  She was placed amoxicillin, Tessalon Perles, and Sterapred Dosepak.  She is to return to the emergency department if worsening.  She is in agreement with treatment plan and discharged in stable condition.     Sabrina Bond was evaluated in Emergency Department on 04/25/2021 for the symptoms described in the history of present illness. She was evaluated in the context of the global COVID-19 pandemic, which necessitated consideration that the patient might be at risk for infection with the SARS-CoV-2 virus that causes COVID-19. Institutional protocols and algorithms that pertain to the evaluation of patients at risk for COVID-19 are in a state of rapid change based on information released by regulatory bodies including the CDC and federal  and state organizations. These policies and algorithms were followed during the patient's care in the ED.    As part of my medical decision making, I reviewed the following data within the electronic MEDICAL RECORD NUMBER Nursing notes reviewed and incorporated, Labs reviewed , Old chart reviewed, Radiograph reviewed , Notes from prior ED visits, and [place] Controlled Substance Database  ____________________________________________   FINAL CLINICAL IMPRESSION(S) / ED DIAGNOSES  Final diagnoses:  Acute upper respiratory infection      NEW MEDICATIONS STARTED DURING THIS VISIT:  Discharge Medication List as of 04/25/2021  8:00 AM     START taking these medications   Details  benzonatate (TESSALON PERLES) 100 MG capsule Take 1 capsule (100 mg total) by mouth every 6 (six) hours as needed for cough., Starting Tue 04/25/2021, Until Wed 04/25/2022 at 2359, Normal    predniSONE (DELTASONE) 10 MG tablet Take 3 tablets (30 mg total) by mouth daily with breakfast., Starting Tue 04/25/2021, Normal  Note:  This document was prepared using Dragon voice recognition software and may include unintentional dictation errors.    Faythe Ghee, PA-C 04/25/21 1347    Willy Eddy, MD 04/25/21 [phone]

## 2021-04-25 NOTE — Discharge Instructions (Addendum)
Follow-up with your regular doctor if not improving in 2 to 3 days.  Return emergency department worsening.  Take your medications as prescribed 

## 2021-04-25 NOTE — ED Triage Notes (Signed)
Patient ambulatory to triage with steady gait, without difficulty or distress noted; pt reports prod cough green sputum, sinus congestion and sore throat x wk

## 2021-04-25 NOTE — ED Notes (Signed)
See triage note  Presents with sinus pressure ,sore throat and cough  Afebrile on arrival

## 2022-06-13 ENCOUNTER — Emergency Department
Admission: EM | Admit: 2022-06-13 | Discharge: 2022-06-13 | Disposition: A | Discharge status: Home / Self Care | Payer: BC Managed Care – PPO | Attending: Emergency Medicine | Admitting: Emergency Medicine

## 2022-06-13 ENCOUNTER — Other Ambulatory Visit: Payer: Self-pay

## 2022-06-13 DIAGNOSIS — I10 Essential (primary) hypertension: Secondary | ICD-10-CM | POA: Insufficient documentation

## 2022-06-13 DIAGNOSIS — M545 Low back pain, unspecified: Secondary | ICD-10-CM | POA: Insufficient documentation

## 2022-06-13 DIAGNOSIS — R030 Elevated blood-pressure reading, without diagnosis of hypertension: Secondary | ICD-10-CM

## 2022-06-13 LAB — URINALYSIS, ROUTINE W REFLEX MICROSCOPIC
Bacteria, UA: NONE SEEN
Bilirubin Urine: NEGATIVE
Glucose, UA: NEGATIVE mg/dL
Hgb urine dipstick: NEGATIVE
Ketones, ur: NEGATIVE mg/dL
Nitrite: NEGATIVE
Protein, ur: 30 mg/dL — AB
Specific Gravity, Urine: 1.011 (ref 1.005–1.030)
pH: 7 (ref 5.0–8.0)

## 2022-06-13 LAB — PREGNANCY, URINE: Preg Test, Ur: NEGATIVE

## 2022-06-13 MED ORDER — METHOCARBAMOL 500 MG PO TABS: ORAL_TABLET | ORAL | 0 refills | Status: DC

## 2022-06-13 MED ORDER — OXYCODONE-ACETAMINOPHEN 7.5-325 MG PO TABS
1.0000 | ORAL_TABLET | Freq: Once | ORAL | Status: AC
  Administered 2022-06-13: 1 via ORAL
  Filled 2022-06-13: qty 1

## 2022-06-13 MED ORDER — OXYCODONE-ACETAMINOPHEN 5-325 MG PO TABS: 1.0000 | ORAL_TABLET | Freq: Four times a day (QID) | ORAL | 0 refills | Status: DC | PRN

## 2022-06-13 MED ORDER — METHOCARBAMOL 500 MG PO TABS
1000.0000 mg | ORAL_TABLET | Freq: Once | ORAL | Status: AC
  Administered 2022-06-13: 1000 mg via ORAL
  Filled 2022-06-13: qty 2

## 2022-06-13 NOTE — ED Provider Notes (Signed)
[facility] Provider Note    Event Date/Time   First MD Initiated Contact with Patient 06/13/22 1407     (approximate)   History   Back Pain   HPI  Sabrina Bond is a 46 y.o. female   presents to the ED with complaint of back pain.  Patient states she has a history of a "bulging disc" and that recently she was doing a lot of bending over at work which may have caused severe aggravation of her back.  She also wants to be assured that this is not a urinary tract infection.  She denies any incontinence of bowel or bladder, saddle anesthesias or radiculopathy.  Patient has a history of diverticulitis, hypertension, arthritis.      Physical Exam   Triage Vital Signs: ED Triage Vitals  Enc Vitals Group     BP 06/13/22 1250 (!) 189/78     Pulse Rate 06/13/22 1250 91     Resp 06/13/22 1250 20     Temp 06/13/22 1250 98.2 F (36.8 C)     Temp Source 06/13/22 1250 Oral     SpO2 06/13/22 1250 94 %     Weight --      Height --      Head Circumference --      Peak Flow --      Pain Score 06/13/22 1245 8     Pain Loc --      Pain Edu? --      Excl. in GC? --     Most recent vital signs: Vitals:   06/13/22 1250 06/13/22 1512  BP: (!) 189/78 (!) 196/88  Pulse: 91   Resp: 20   Temp: 98.2 F (36.8 C)   SpO2: 94%      General: Awake, no distress.  Overall looks extremely uncomfortable and tearful. CV:  Good peripheral perfusion.  Heart regular rate rhythm. Resp:  Normal effort.  Lungs are clear bilaterally. Abd:  No distention.  Other:  Examination of the lumbar spine there is no gross deformity however there is moderate tenderness on palpation L5-S1 area and paravertebral muscles bilaterally.  Patient is having difficulty with range of motion secondary to increased pain and seems to be having muscle spasms preventing her to freely move.  Straight leg raises are approximately 20 degrees bilaterally.  Good muscle strength bilaterally at 5/5.  Patient is able  to stand and slowly ambulate without any assistance.   ED Results / Procedures / Treatments   Labs (all labs ordered are listed, but only abnormal results are displayed) Labs Reviewed  URINALYSIS, ROUTINE W REFLEX MICROSCOPIC - Abnormal; Notable for the following components:      Result Value   Color, Urine YELLOW (*)    APPearance HAZY (*)    Protein, ur 30 (*)    Leukocytes,Ua LARGE (*)    All other components within normal limits  PREGNANCY, URINE      PROCEDURES:  Critical Care performed:   Procedures   MEDICATIONS ORDERED IN ED: Medications  oxyCODONE-acetaminophen (PERCOCET) 7.5-325 MG per tablet 1 tablet (1 tablet Oral Given 06/13/22 1446)  methocarbamol (ROBAXIN) tablet 1,000 mg (1,000 mg Oral Given by Other 06/13/22 1457)     IMPRESSION / MDM / ASSESSMENT AND PLAN / ED COURSE  I reviewed the triage vital signs and the nursing notes.   Differential diagnosis includes, but is not limited to, urinary tract infection, muscle strain, active muscle spasms, history of degenerative disc disease.  46 year old female presents to the ED with complaint of low back pain without specific recent injury.  Patient has a history of degenerative disc disease and reports that she has been doing a lot of bending over at work as she works in a [employer].  Patient denies any incontinence of bowel or bladder and does not exhibit any signs of cauda equina.  She was given methocarbamol and oxycodone while in the ED.  She is encouraged to use ice or heat to her back as needed for discomfort and a prescription for the methocarbamol and oxycodone was sent to the pharmacy for her to take as needed for pain.  She is to follow-up with her PCP or return to [facility] where she has been seen before for her back.  Blood pressure was elevated while in the ED which may be a result of her back pain versus blood pressure not being controlled with her current medication.  She will follow-up with her PCP  on this as well.      Patient's presentation is most consistent with acute complicated illness / injury requiring diagnostic workup.  FINAL CLINICAL IMPRESSION(S) / ED DIAGNOSES   Final diagnoses:  Acute midline low back pain without sciatica  Elevated blood pressure reading     Rx / DC Orders   ED Discharge Orders          Ordered    methocarbamol (ROBAXIN) 500 MG tablet        06/13/22 1503    oxyCODONE-acetaminophen (PERCOCET) 5-325 MG tablet  Every 6 hours PRN        06/13/22 1503             Note:  This document was prepared using Dragon voice recognition software and may include unintentional dictation errors.   Johnn Hai, PA-C 06/13/22 1522    Lavonia Drafts, MD 06/13/22 1556

## 2022-06-13 NOTE — ED Triage Notes (Signed)
Pt comes with c/o back pain the patient unsure if she pulled something or has UTI.

## 2022-06-13 NOTE — Discharge Instructions (Addendum)
Follow-up with your primary care provider or [facility] if any continued problems with your back.  You were given 2 medications while in the emergency department 1 was a muscle relaxant and the other a pain medication.  These medications were also sent to the pharmacy to take to help with muscle spasms and pain.  Be aware that both medications cannot be taken if you plan to drive or operate machinery as it could cause drowsiness and increase your risk for injury.  You may also use heat or ice to your back as needed for discomfort. Also while in the emergency department your blood pressure was elevated which may be due to pain.  Have your primary care provider recheck your blood pressure to see if medication needs to be adjusted.

## 2022-08-10 ENCOUNTER — Other Ambulatory Visit: Payer: Self-pay

## 2022-08-10 DIAGNOSIS — Z1231 Encounter for screening mammogram for malignant neoplasm of breast: Secondary | ICD-10-CM

## 2022-09-10 ENCOUNTER — Ambulatory Visit
Admission: RE | Admit: 2022-09-10 | Discharge: 2022-09-10 | Disposition: A | Discharge status: Home / Self Care | Payer: Medicaid Other | Source: Ambulatory Visit | Attending: Obstetrics and Gynecology | Admitting: Obstetrics and Gynecology

## 2022-09-10 ENCOUNTER — Ambulatory Visit: Payer: Medicaid Other | Attending: Hematology and Oncology | Admitting: Hematology and Oncology

## 2022-09-10 ENCOUNTER — Other Ambulatory Visit: Payer: Self-pay

## 2022-09-10 VITALS — BP 187/85 | Wt 248.8 lb

## 2022-09-10 DIAGNOSIS — Z1211 Encounter for screening for malignant neoplasm of colon: Secondary | ICD-10-CM

## 2022-09-10 DIAGNOSIS — Z1231 Encounter for screening mammogram for malignant neoplasm of breast: Secondary | ICD-10-CM

## 2022-09-10 DIAGNOSIS — Z01419 Encounter for gynecological examination (general) (routine) without abnormal findings: Secondary | ICD-10-CM

## 2022-09-10 DIAGNOSIS — Z124 Encounter for screening for malignant neoplasm of cervix: Secondary | ICD-10-CM

## 2022-09-10 NOTE — Progress Notes (Signed)
[name] is a 47 y.o. [id number] female who presents to [facility] clinic today with no complaints.    Pap Smear: Pap smear completed today. Last Pap smear was 7 years ago and was normal. Per patient has no history of an abnormal Pap smear. Last Pap smear result is available in Epic.   Physical exam: Breasts Breasts symmetrical. No skin abnormalities bilateral breasts. No nipple retraction bilateral breasts. No nipple discharge bilateral breasts. No lymphadenopathy. No lumps palpated bilateral breasts.     MS DIGITAL DIAG TOMO BILAT  Result Date: 08/28/2018 CLINICAL DATA:  Patient describes a palpable lump within the LEFT breast for approximately a month. Patient also describes associated breast pain. EXAM: DIGITAL DIAGNOSTIC BILATERAL MAMMOGRAM WITH CAD AND TOMO ULTRASOUND LEFT BREAST COMPARISON:  Previous exam(s). ACR Breast Density Category b: There are scattered areas of fibroglandular density. FINDINGS: There are no new dominant masses, suspicious calcifications or secondary signs of malignancy within either breast. Specifically, there is no mammographic abnormality within the outer LEFT breast corresponding to the area of clinical concern with overlying skin marker in place. Mammographic images were processed with CAD. Targeted ultrasound is performed, evaluating the upper-outer quadrant of the LEFT breast, 2-3 o'clock axes, as directed by the patient, showing only normal fibroglandular tissues and fat lobules. There is a prominent fat lobule at the 2:30 o'clock axis, corresponding to the area of clinical concern. No solid or cystic mass is identified. IMPRESSION: No evidence of malignancy within either breast. RECOMMENDATION: 1.  Screening mammogram in one year.(Code:[id number]-01Y) 2. The patient was instructed to return sooner if the area that she feels becomes larger and/or firmer to palpation, or if a new palpable abnormality is identified in either breast. 3. Benign causes of breast pain, and possible  remedies, were discussed with the patient. I have discussed the findings and recommendations with the patient. Results were also provided in writing at the conclusion of the visit. If applicable, a reminder letter will be sent to the patient regarding the next appointment. BI-RADS CATEGORY  1: Negative. Electronically Signed   By: Franki Cabot M.D.   On: 08/28/2018 15:05      Pelvic/Bimanual Ext Genitalia No lesions, no swelling and no discharge observed on external genitalia.        Vagina Vagina pink and normal texture. No lesions or discharge observed in vagina.        Cervix Cervix is present. Cervix pink and of normal texture. No discharge observed.    Uterus Uterus is present and palpable. Uterus in normal position and normal size.        Adnexae Bilateral ovaries present and palpable. No tenderness on palpation.         Rectovaginal No rectal exam completed today since patient had no rectal complaints. No skin abnormalities observed on exam.     Smoking History: Patient has never smoked and was not referred to quit line.    Patient Navigation: Patient education provided. Access to services provided for patient through [facility] program. No interpreter provided. No transportation provided   Colorectal Cancer Screening: Per patient has never had colonoscopy completed No complaints today. FIT test given.   Breast and Cervical Cancer Risk Assessment: Patient does not have family history of breast cancer, known genetic mutations, or radiation treatment to the chest before age 26. Patient does not have history of cervical dysplasia, immunocompromised, or DES exposure in-utero.  Risk Assessment   No risk assessment data for the current encounter  Risk Scores  08/28/2018   Last edited by: Loletta Parish, RN   5-year risk: 0.7 %   Lifetime risk: 9.5 %            A: [facility] exam with pap smear No complaints with benign exam.   P: [facility]erred [facility] for a screening mammogram. Appointment scheduled 09/10/22.  Melodye Ped, NP 09/10/2022 2:57 PM

## 2022-09-10 NOTE — Patient Instructions (Signed)
Taught Sabrina Bond about  BSE and gave educational materials to take home. Patient did need a Pap smear today due to last Pap smear was 7 years ago per patient.  Let her know [facility] will cover Pap smears every 5 years unless has a history of abnormal Pap smears. Referred [facility] to the [facility] for screening mammogram. Appointment scheduled for 09/10/22. Patient aware of appointment and will be there. Let patient know will follow up with her within the next couple weeks with results. Sabrina Bond verbalized understanding.  Melodye Ped, NP 2:59 PM

## 2022-09-12 ENCOUNTER — Other Ambulatory Visit: Payer: Self-pay | Admitting: Obstetrics and Gynecology

## 2022-09-12 DIAGNOSIS — R928 Other abnormal and inconclusive findings on diagnostic imaging of breast: Secondary | ICD-10-CM

## 2022-09-12 DIAGNOSIS — N6489 Other specified disorders of breast: Secondary | ICD-10-CM

## 2022-09-13 ENCOUNTER — Ambulatory Visit
Admission: RE | Admit: 2022-09-13 | Discharge: 2022-09-13 | Disposition: A | Discharge status: Home / Self Care | Payer: Self-pay | Source: Ambulatory Visit | Attending: Obstetrics and Gynecology | Admitting: Obstetrics and Gynecology

## 2022-09-13 DIAGNOSIS — R928 Other abnormal and inconclusive findings on diagnostic imaging of breast: Secondary | ICD-10-CM

## 2022-09-13 DIAGNOSIS — N6489 Other specified disorders of breast: Secondary | ICD-10-CM

## 2022-09-14 ENCOUNTER — Telehealth: Payer: Self-pay

## 2022-09-14 LAB — CYTOLOGY - PAP
Comment: NEGATIVE
Diagnosis: NEGATIVE
High risk HPV: NEGATIVE

## 2022-09-14 NOTE — Telephone Encounter (Signed)
I left a message for the pt advising her lab tests were normal (PAP/HPV) and she will need to repeat in 5 years. Pt was invited to call back if she has any questions or concerns.

## 2022-10-23 ENCOUNTER — Other Ambulatory Visit: Payer: Self-pay

## 2022-10-23 DIAGNOSIS — N6321 Unspecified lump in the left breast, upper outer quadrant: Secondary | ICD-10-CM

## 2023-01-15 ENCOUNTER — Ambulatory Visit: Payer: Medicaid Other | Admitting: Podiatry

## 2023-01-22 ENCOUNTER — Ambulatory Visit: Payer: Medicaid Other | Admitting: Podiatry

## 2023-01-25 ENCOUNTER — Other Ambulatory Visit: Payer: Self-pay

## 2023-01-25 DIAGNOSIS — I10 Essential (primary) hypertension: Secondary | ICD-10-CM | POA: Insufficient documentation

## 2023-01-25 LAB — CBC
HCT: 32 % — ABNORMAL LOW (ref 36.0–46.0)
Hemoglobin: 10.5 g/dL — ABNORMAL LOW (ref 12.0–15.0)
MCH: 27.2 pg (ref 26.0–34.0)
MCHC: 32.8 g/dL (ref 30.0–36.0)
MCV: 82.9 fL (ref 80.0–100.0)
Platelets: 234 10*3/uL (ref 150–400)
RBC: 3.86 MIL/uL — ABNORMAL LOW (ref 3.87–5.11)
RDW: 14 % (ref 11.5–15.5)
WBC: 7.6 10*3/uL (ref 4.0–10.5)
nRBC: 0 % (ref 0.0–0.2)

## 2023-01-25 NOTE — ED Triage Notes (Signed)
Pt to ed from home via [facility] for HTN.  Pt was in hypertensive crisis a week ago. Started carvedilol one week ago.   186/99 100%  79HR   Pt is caox4, in no acute distress.

## 2023-01-26 ENCOUNTER — Emergency Department
Admission: EM | Admit: 2023-01-26 | Discharge: 2023-01-26 | Disposition: A | Discharge status: Home / Self Care | Payer: Medicaid Other | Attending: Emergency Medicine | Admitting: Emergency Medicine

## 2023-01-26 DIAGNOSIS — I10 Essential (primary) hypertension: Secondary | ICD-10-CM

## 2023-01-26 LAB — BASIC METABOLIC PANEL
Anion gap: 8 (ref 5–15)
BUN: 26 mg/dL — ABNORMAL HIGH (ref 6–20)
CO2: 22 mmol/L (ref 22–32)
Calcium: 8.7 mg/dL — ABNORMAL LOW (ref 8.9–10.3)
Chloride: 107 mmol/L (ref 98–111)
Creatinine, Ser: 1.33 mg/dL — ABNORMAL HIGH (ref 0.44–1.00)
GFR, Estimated: 50 mL/min — ABNORMAL LOW (ref >60)
Glucose, Bld: 109 mg/dL — ABNORMAL HIGH (ref 70–99)
Potassium: 3.7 mmol/L (ref 3.5–5.1)
Sodium: 137 mmol/L (ref 135–145)

## 2023-01-26 LAB — TROPONIN I (HIGH SENSITIVITY)
Troponin I (High Sensitivity): 6 ng/L (ref <18)
Troponin I (High Sensitivity): 7 ng/L (ref <18)

## 2023-01-26 MED ORDER — HYDRALAZINE HCL 10 MG PO TABS: 10.0000 mg | ORAL_TABLET | Freq: Once | ORAL | Status: DC

## 2023-01-26 MED ORDER — ATORVASTATIN CALCIUM 20 MG PO TABS: 20.0000 mg | ORAL_TABLET | Freq: Every day | ORAL | 2 refills | Status: AC

## 2023-01-26 MED ORDER — CARVEDILOL 6.25 MG PO TABS
12.5000 mg | ORAL_TABLET | Freq: Once | ORAL | Status: AC
  Administered 2023-01-26: 12.5 mg via ORAL
  Filled 2023-01-26: qty 2

## 2023-01-26 MED ORDER — LORAZEPAM 1 MG PO TABS
1.0000 mg | ORAL_TABLET | Freq: Once | ORAL | Status: AC
  Administered 2023-01-26: 1 mg via ORAL
  Filled 2023-01-26: qty 1

## 2023-01-26 MED ORDER — HYDROXYZINE HCL 25 MG PO TABS: 25.0000 mg | ORAL_TABLET | Freq: Three times a day (TID) | ORAL | 0 refills | Status: AC | PRN

## 2023-01-26 MED ORDER — CARVEDILOL 12.5 MG PO TABS: 12.5000 mg | ORAL_TABLET | Freq: Two times a day (BID) | ORAL | 2 refills | Status: DC

## 2023-01-26 NOTE — ED Provider Notes (Signed)
[facility] Provider Note    Event Date/Time   First MD Initiated Contact with Patient 01/26/23 [id number]     (approximate)  History   Chief Complaint: Hypertension  HPI  Sabrina Bond is a 47 y.o. female with a past med history of hypertension, hyperlipidemia, presents to the emergency department for high blood pressure.  According to the patient she has been experiencing very high blood pressure recently.  Patient states her doctor recently changed her blood pressure medication to carvedilol 12.5 mg twice daily in addition to aspirin and atorvastatin.  Patient states they were concerned about her kidneys and they referred her to a nephrologist.  Patient states she has been taking her medications as prescribed however earlier today she felt like her heart was pounding in her chest so she checked her blood pressure and it was once again significantly elevated so the patient came to the emergency department.  Patient denies any headache.  No chest pain or shortness of breath.  Blood pressure currently 188/91.  Physical Exam   Triage Vital Signs: ED Triage Vitals  Enc Vitals Group     BP 01/25/23 2324 (!) 150/94     Pulse Rate 01/25/23 2324 70     Resp 01/25/23 2324 16     Temp 01/25/23 2324 98 F (36.7 C)     Temp Source 01/25/23 2324 Oral     SpO2 01/25/23 2324 99 %     Weight 01/25/23 2322 245 lb (111.1 kg)     Height 01/25/23 2322 5\' 4"  (1.626 m)     Head Circumference --      Peak Flow --      Pain Score 01/25/23 2322 0     Pain Loc --      Pain Edu? --      Excl. in GC? --     Most recent vital signs: Vitals:   01/26/23 0229 01/26/23 0400  BP: (!) 203/93 (!) 188/91  Pulse: 78 79  Resp: 16 17  Temp: 98.2 F (36.8 C)   SpO2: 99% 100%    General: Awake, no distress.  CV:  Good peripheral perfusion.  Regular rate and rhythm  Resp:  Normal effort.  Equal breath sounds bilaterally.  Abd:  No distention.  Soft, nontender.  No rebound or guarding.  ED  Results / Procedures / Treatments   EKG  EKG viewed and interpreted by myself shows a normal sinus rhythm at 71 bpm with a narrow QRS, normal axis, normal intervals, no concerning ST changes  MEDICATIONS ORDERED IN ED: Medications  carvedilol (COREG) tablet 12.5 mg (has no administration in time range)  LORazepam (ATIVAN) tablet 1 mg (has no administration in time range)     IMPRESSION / MDM / ASSESSMENT AND PLAN / ED COURSE  I reviewed the triage vital signs and the nursing notes.  Patient's presentation is most consistent with acute presentation with potential threat to life or bodily function.  Patient presents emergency department for high blood pressure.  Patient is taking carvedilol 12.5 mg twice daily has not yet taken her morning dose.  Patient is somewhat anxious appearing as well, concerned about her kidney function and high blood pressure.  Denies any headache no chest pain no shortness of breath.  We will check labs dose her morning carvedilol medication as well as give a small dose of Ativan given the patient's anxiety during evaluation.  Patient's labs have resulted showing a reassuring CBC reassuring chemistry with stable  kidney function negative troponin x 2.  We will recheck the patient's blood pressure after medication administration.  Patient agreeable to plan.  Patient's blood pressure has come down considerably currently 139/67.  Highly suspect the patient's spikes in blood pressure could be related more to anxiety/stress.  Will prescribe a short course of hydroxyzine for the patient have her follow-up with her doctor.  Patient to continue taking her normal blood pressure medication.  Patient agreeable to plan of care.  FINAL CLINICAL IMPRESSION(S) / ED DIAGNOSES   Hypertension  Note:  This document was prepared using Dragon voice recognition software and may include unintentional dictation errors.   Minna Antis, MD 01/26/23 [phone]

## 2023-01-26 NOTE — ED Notes (Signed)
Patient had previously been on amlodipine, losarrtan and hydrochlorothazide.

## 2023-01-26 NOTE — ED Notes (Signed)
Patient reports feeling like she is having palpitations.  Repeat EKG done and given to Dr. Deberah Castle (no acute changes) and vitals updated.

## 2023-03-05 ENCOUNTER — Ambulatory Visit (INDEPENDENT_AMBULATORY_CARE_PROVIDER_SITE_OTHER): Payer: Medicaid Other | Admitting: Podiatry

## 2023-03-05 DIAGNOSIS — M7989 Other specified soft tissue disorders: Secondary | ICD-10-CM | POA: Diagnosis not present

## 2023-03-05 DIAGNOSIS — M67471 Ganglion, right ankle and foot: Secondary | ICD-10-CM | POA: Diagnosis not present

## 2023-03-05 NOTE — Progress Notes (Signed)
Subjective:  Patient ID: Sabrina Bond, female    DOB: 1976-03-12,  MRN: [id number]  Chief Complaint  Patient presents with   Foot Pain    Place on side of foot causing slight discomfort     47 y.o. female presents with the above complaint.  Patient presents with right ankle soft tissue mass/lipoma.  Patient states causing her some pain.  She wanted to get it evaluation does not want surgery if she can avoid it causes some discomfort especially while ambulating and may press on the nerve causes shooting tingling burning sensation.  She denies any other acute complaints she is doing.  She is doing routine self.   Review of Systems: Negative except as noted in the HPI. Denies N/V/F/Ch.  Past Medical History:  Diagnosis Date   Arthritis    Diverticulitis    Hypertension     Current Outpatient Medications:    amLODipine (NORVASC) 5 MG tablet, Take by mouth., Disp: , Rfl:    atorvastatin (LIPITOR) 20 MG tablet, Take 1 tablet (20 mg total) by mouth daily., Disp: 30 tablet, Rfl: 2   carvedilol (COREG) 12.5 MG tablet, Take 1 tablet (12.5 mg total) by mouth 2 (two) times daily., Disp: 60 tablet, Rfl: 2   Cholecalciferol (VITAMIN D-3) 125 MCG (5000 UT) TABS, Take 1 tablet by mouth daily., Disp: 90 tablet, Rfl: 3   famotidine (PEPCID) 20 MG tablet, Take 20 mg by mouth as needed for heartburn or indigestion., Disp: , Rfl:    hydrOXYzine (ATARAX) 25 MG tablet, Take 1 tablet (25 mg total) by mouth 3 (three) times daily as needed for anxiety., Disp: 20 tablet, Rfl: 0   losartan (COZAAR) 25 MG tablet, Take 1 tablet (25 mg total) by mouth daily., Disp: 30 tablet, Rfl: 0   meloxicam (MOBIC) 7.5 MG tablet, Take by mouth., Disp: , Rfl:    methocarbamol (ROBAXIN) 500 MG tablet, 1-2 tablets every 6 hours prn muscle spasms, Disp: 20 tablet, Rfl: 0   omeprazole (PRILOSEC) 20 MG capsule, Take 20 mg by mouth as needed (acd reflux)., Disp: , Rfl:    oxyCODONE-acetaminophen (PERCOCET) 5-325 MG tablet, Take 1 tablet  by mouth every 6 (six) hours as needed for severe pain., Disp: 15 tablet, Rfl: 0  Social History   Tobacco Use  Smoking Status Never  Smokeless Tobacco Never    Allergies  Allergen Reactions   Kiwi Extract Shortness Of Breath   Metoprolol Other (See Comments)    Chest pains-sharp and stabbing   Benicar [Olmesartan] Other (See Comments)    Chest pains   Peanut-Containing Drug Products Other (See Comments)    Heart flutters   Naproxen Itching and Rash   Objective:  There were no vitals filed for this visit. There is no height or weight on file to calculate BMI. Constitutional Well developed. Well nourished.  Vascular Dorsalis pedis pulses palpable bilaterally. Posterior tibial pulses palpable bilaterally. Capillary refill normal to all digits.  No cyanosis or clubbing noted. Pedal hair growth normal.  Neurologic Normal speech. Oriented to person, place, and time. Epicritic sensation to light touch grossly present bilaterally.  Dermatologic Nails well groomed and normal in appearance. No open wounds. No skin lesions.  Orthopedic: Mobile soft tissue mass noted to the right lateral foot.  No negative transilluminates noted.  None indurated soft tissue mass noted.  Findings consistent with lipoma   Radiographs: None Assessment:   1. Soft tissue mass   2. Ganglion cyst of right foot    Plan:  Patient was evaluated and treated and all questions answered.  Right lateral soft tissue mass/lipoma/cyst -All questions and concerns were discussed with the patient in extensive detail.  Given the amount of pain that she explained she will benefit from drainage of the lesion.  At this time I discussed with him that I will attempt aspiration and drainage followed by injection with steroid injection to see if that gives her relief.  However given that this may be a lipoma there may not be much drainage I discussed with patient she states understand like to proceed with surgery -Using  one-to-one mixture about a percent lidocaine and Marcaine half percent 10 cc were locally infiltrated in V-shaped fashion.  Using 18-gauge needle the cyst was punctured but no drainage was noted no gelatinous material expressed.  At this time this is more considered a lipoma as opposed to gelatin cyst. -If there is no improvement we will discuss surgical excision  No follow-ups on file.

## 2023-03-07 ENCOUNTER — Ambulatory Visit (INDEPENDENT_AMBULATORY_CARE_PROVIDER_SITE_OTHER): Payer: Medicaid Other | Admitting: Podiatry

## 2023-03-07 DIAGNOSIS — M7989 Other specified soft tissue disorders: Secondary | ICD-10-CM | POA: Diagnosis not present

## 2023-03-07 DIAGNOSIS — M67471 Ganglion, right ankle and foot: Secondary | ICD-10-CM | POA: Diagnosis not present

## 2023-03-07 DIAGNOSIS — Z01818 Encounter for other preprocedural examination: Secondary | ICD-10-CM | POA: Diagnosis not present

## 2023-03-07 NOTE — Progress Notes (Signed)
Subjective:  Patient ID: Sabrina Bond, female    DOB: February 17, 1976,  MRN: [id number]  Chief Complaint  Patient presents with   soft tissue mass     Right ankle lipoma     47 y.o. female presents with the above complaint.  Patient presents for follow-up of right ankle lipoma.  She states that the injection did not help.  She would like to have it taken out is causing her more pain and issues.  She denies any other acute complaints   Review of Systems: Negative except as noted in the HPI. Denies N/V/F/Ch.  Past Medical History:  Diagnosis Date   Arthritis    Diverticulitis    Hypertension     Current Outpatient Medications:    amLODipine (NORVASC) 5 MG tablet, Take by mouth., Disp: , Rfl:    atorvastatin (LIPITOR) 20 MG tablet, Take 1 tablet (20 mg total) by mouth daily., Disp: 30 tablet, Rfl: 2   carvedilol (COREG) 12.5 MG tablet, Take 1 tablet (12.5 mg total) by mouth 2 (two) times daily., Disp: 60 tablet, Rfl: 2   Cholecalciferol (VITAMIN D-3) 125 MCG (5000 UT) TABS, Take 1 tablet by mouth daily., Disp: 90 tablet, Rfl: 3   famotidine (PEPCID) 20 MG tablet, Take 20 mg by mouth as needed for heartburn or indigestion., Disp: , Rfl:    hydrOXYzine (ATARAX) 25 MG tablet, Take 1 tablet (25 mg total) by mouth 3 (three) times daily as needed for anxiety., Disp: 20 tablet, Rfl: 0   losartan (COZAAR) 25 MG tablet, Take 1 tablet (25 mg total) by mouth daily., Disp: 30 tablet, Rfl: 0   meloxicam (MOBIC) 7.5 MG tablet, Take by mouth., Disp: , Rfl:    methocarbamol (ROBAXIN) 500 MG tablet, 1-2 tablets every 6 hours prn muscle spasms, Disp: 20 tablet, Rfl: 0   omeprazole (PRILOSEC) 20 MG capsule, Take 20 mg by mouth as needed (acd reflux)., Disp: , Rfl:    oxyCODONE-acetaminophen (PERCOCET) 5-325 MG tablet, Take 1 tablet by mouth every 6 (six) hours as needed for severe pain., Disp: 15 tablet, Rfl: 0  Social History   Tobacco Use  Smoking Status Never  Smokeless Tobacco Never    Allergies   Allergen Reactions   Kiwi Extract Shortness Of Breath   Metoprolol Other (See Comments)    Chest pains-sharp and stabbing   Benicar [Olmesartan] Other (See Comments)    Chest pains   Peanut-Containing Drug Products Other (See Comments)    Heart flutters   Naproxen Itching and Rash   Objective:  There were no vitals filed for this visit. There is no height or weight on file to calculate BMI. Constitutional Well developed. Well nourished.  Vascular Dorsalis pedis pulses palpable bilaterally. Posterior tibial pulses palpable bilaterally. Capillary refill normal to all digits.  No cyanosis or clubbing noted. Pedal hair growth normal.  Neurologic Normal speech. Oriented to person, place, and time. Epicritic sensation to light touch grossly present bilaterally.  Dermatologic Nails well groomed and normal in appearance. No open wounds. No skin lesions.  Orthopedic: Mobile soft tissue mass noted to the right lateral foot.  No negative transilluminates noted.  None indurated soft tissue mass noted.  Findings consistent with lipoma   Radiographs: None Assessment:   1. Soft tissue mass   2. Ganglion cyst of right foot   3. Encounter for preoperative examination for general surgical procedure     Plan:  Patient was evaluated and treated and all questions answered.  Right lateral soft tissue  mass/lipoma/cyst -All questions and concerns were discussed with the patient in extensive detail.  Given that she has failed conservative care including padding protecting offloading along with aspiration and injection done by me I believe she will benefit from surgical excision of the lipoma.  I discussed my preoperative intra postop exam with the patient in extensive detail she states understanding would like to proceed with surgery. -Informed surgical risk consent was reviewed and read aloud to the patient.  I reviewed the films.  I have discussed my findings with the patient in great detail.  I  have discussed all risks including but not limited to infection, stiffness, scarring, limp, disability, deformity, damage to blood vessels and nerves, numbness, poor healing, need for braces, arthritis, chronic pain, amputation, death.  All benefits and realistic expectations discussed in great detail.  I have made no promises as to the outcome.  I have provided realistic expectations.  I have offered the patient a 2nd opinion, which they have declined and assured me they preferred to proceed despite the risks   No follow-ups on file.

## 2023-03-21 ENCOUNTER — Telehealth: Payer: Self-pay | Admitting: Urology

## 2023-03-21 NOTE — Telephone Encounter (Signed)
DOS - 04/15/23  EXC OF ANKLE LIPOMA RIGHT --- 24401  MEDICAID [employer] [place]   PER [employer] [place] WEBSITE FOR CPT CODE 02725 NO PRIOR AUTH IS REQUIRED.

## 2023-03-22 ENCOUNTER — Other Ambulatory Visit: Payer: Medicaid Other

## 2023-03-22 ENCOUNTER — Inpatient Hospital Stay: Admission: RE | Admit: 2023-03-22 | Payer: Medicaid Other | Source: Ambulatory Visit

## 2023-04-10 ENCOUNTER — Ambulatory Visit
Admission: RE | Admit: 2023-04-10 | Discharge: 2023-04-10 | Disposition: A | Discharge status: Home / Self Care | Payer: Medicaid Other | Source: Ambulatory Visit | Attending: Obstetrics and Gynecology | Admitting: Obstetrics and Gynecology

## 2023-04-10 ENCOUNTER — Telehealth: Payer: Self-pay

## 2023-04-10 DIAGNOSIS — N6321 Unspecified lump in the left breast, upper outer quadrant: Secondary | ICD-10-CM | POA: Insufficient documentation

## 2023-04-10 NOTE — Telephone Encounter (Signed)
Patient called 04/09/23 and left a message. She is scheduled for surgery on Monday 04/15/23. She wanted to know when she should stop her aspirin prior to surgery. I called and spoke with her and advised that we usually ask patients to stop aspirin 7 days prior to surgery, but it would be ok if she had not. She stopped her aspirin on 04/08/23 thanks

## 2023-04-15 ENCOUNTER — Other Ambulatory Visit: Payer: Self-pay | Admitting: Podiatry

## 2023-04-15 ENCOUNTER — Telehealth: Payer: Self-pay | Admitting: Podiatry

## 2023-04-15 DIAGNOSIS — M7989 Other specified soft tissue disorders: Secondary | ICD-10-CM | POA: Diagnosis not present

## 2023-04-15 MED ORDER — OXYCODONE-ACETAMINOPHEN 5-325 MG PO TABS: 1.0000 | ORAL_TABLET | ORAL | 0 refills | Status: DC | PRN

## 2023-04-15 MED ORDER — IBUPROFEN 800 MG PO TABS: 800.0000 mg | ORAL_TABLET | Freq: Four times a day (QID) | ORAL | 1 refills | Status: AC | PRN

## 2023-04-15 NOTE — Telephone Encounter (Signed)
Pt's scripts were sent to the wrong pharmacy; she's uses the [facility] on [address] in [place]. She would like medication resent to this pharmacy. Please advise

## 2023-04-16 ENCOUNTER — Telehealth: Payer: Self-pay

## 2023-04-16 MED ORDER — IBUPROFEN 800 MG PO TABS: 800.0000 mg | ORAL_TABLET | Freq: Four times a day (QID) | ORAL | 1 refills | Status: AC | PRN

## 2023-04-16 MED ORDER — OXYCODONE-ACETAMINOPHEN 5-325 MG PO TABS: 1.0000 | ORAL_TABLET | ORAL | 0 refills | Status: DC | PRN

## 2023-04-16 MED ORDER — OXYCODONE-ACETAMINOPHEN 5-325 MG PO TABS: 1.0000 | ORAL_TABLET | ORAL | 0 refills | Status: AC | PRN

## 2023-04-16 NOTE — Telephone Encounter (Signed)
error 

## 2023-04-16 NOTE — Telephone Encounter (Signed)
Patient called again this morning - her pain medicine was sent to the wrong pharmacy - she needs it sent to [facility] on [address] - Please send medication Thanks

## 2023-04-16 NOTE — Addendum Note (Signed)
Addended by: Nicholes Rough on: 04/16/2023 01:59 PM   Modules accepted: Orders

## 2023-04-23 ENCOUNTER — Encounter: Payer: Medicaid Other | Admitting: Podiatry

## 2023-04-25 ENCOUNTER — Ambulatory Visit: Payer: Medicaid Other | Admitting: Podiatry

## 2023-04-25 DIAGNOSIS — Z9889 Other specified postprocedural states: Secondary | ICD-10-CM

## 2023-04-25 DIAGNOSIS — M7989 Other specified soft tissue disorders: Secondary | ICD-10-CM

## 2023-04-25 NOTE — Progress Notes (Signed)
Subjective:  Patient ID: Sabrina Bond, female    DOB: Jun 04, 1976,  MRN: [id number]  Chief Complaint  Patient presents with   Routine Post Op    POV # 1 DOS 04/15/23 --- RIGHT EXCISION OF ANKLE LIPOMA    DOS: 04/15/2023 Procedure: Right digit of lipoma  47 y.o. female returns for post-op check.  Patient states that she is doing well.  Minimal pain.  Bandages clean dry and intact.  Review of Systems: Negative except as noted in the HPI. Denies N/V/F/Ch.  Past Medical History:  Diagnosis Date   Arthritis    Diverticulitis    Hypertension     Current Outpatient Medications:    amLODipine (NORVASC) 5 MG tablet, Take by mouth., Disp: , Rfl:    atorvastatin (LIPITOR) 20 MG tablet, Take 1 tablet (20 mg total) by mouth daily., Disp: 30 tablet, Rfl: 2   carvedilol (COREG) 12.5 MG tablet, Take 1 tablet (12.5 mg total) by mouth 2 (two) times daily., Disp: 60 tablet, Rfl: 2   Cholecalciferol (VITAMIN D-3) 125 MCG (5000 UT) TABS, Take 1 tablet by mouth daily., Disp: 90 tablet, Rfl: 3   famotidine (PEPCID) 20 MG tablet, Take 20 mg by mouth as needed for heartburn or indigestion., Disp: , Rfl:    hydrOXYzine (ATARAX) 25 MG tablet, Take 1 tablet (25 mg total) by mouth 3 (three) times daily as needed for anxiety., Disp: 20 tablet, Rfl: 0   ibuprofen (ADVIL) 800 MG tablet, Take 1 tablet (800 mg total) by mouth every 6 (six) hours as needed., Disp: 60 tablet, Rfl: 1   ibuprofen (ADVIL) 800 MG tablet, Take 1 tablet (800 mg total) by mouth every 6 (six) hours as needed., Disp: 60 tablet, Rfl: 1   ibuprofen (ADVIL) 800 MG tablet, Take 1 tablet (800 mg total) by mouth every 6 (six) hours as needed., Disp: 60 tablet, Rfl: 1   losartan (COZAAR) 25 MG tablet, Take 1 tablet (25 mg total) by mouth daily., Disp: 30 tablet, Rfl: 0   meloxicam (MOBIC) 7.5 MG tablet, Take by mouth., Disp: , Rfl:    methocarbamol (ROBAXIN) 500 MG tablet, 1-2 tablets every 6 hours prn muscle spasms, Disp: 20 tablet, Rfl: 0   omeprazole  (PRILOSEC) 20 MG capsule, Take 20 mg by mouth as needed (acd reflux)., Disp: , Rfl:    oxyCODONE-acetaminophen (PERCOCET) 5-325 MG tablet, Take 1 tablet by mouth every 6 (six) hours as needed for severe pain., Disp: 15 tablet, Rfl: 0   oxyCODONE-acetaminophen (PERCOCET) 5-325 MG tablet, Take 1 tablet by mouth every 4 (four) hours as needed for severe pain., Disp: 30 tablet, Rfl: 0   oxyCODONE-acetaminophen (PERCOCET) 5-325 MG tablet, Take 1 tablet by mouth every 4 (four) hours as needed for severe pain., Disp: 30 tablet, Rfl: 0  Social History   Tobacco Use  Smoking Status Never  Smokeless Tobacco Never    Allergies  Allergen Reactions   Kiwi Extract Shortness Of Breath   Metoprolol Other (See Comments)    Chest pains-sharp and stabbing   Benicar [Olmesartan] Other (See Comments)    Chest pains   Peanut-Containing Drug Products Other (See Comments)    Heart flutters   Naproxen Itching and Rash   Objective:  There were no vitals filed for this visit. There is no height or weight on file to calculate BMI. Constitutional Well developed. Well nourished.  Vascular Foot warm and well perfused. Capillary refill normal to all digits.   Neurologic Normal speech. Oriented to person, place,  and time. Epicritic sensation to light touch grossly present bilaterally.  Dermatologic Skin healing well without signs of infection. Skin edges well coapted without signs of infection.  Orthopedic: Tenderness to palpation noted about the surgical site.   Radiographs: None Assessment:   1. Status post foot surgery   2. Soft tissue mass    Plan:  Patient was evaluated and treated and all questions answered.  S/p foot surgery right -Progressing as expected post-operatively. -XR: See above -WB Status: Weightbearing as tolerated in cam boot -Sutures: Intact.  No clinical signs of dehiscence noted no complication noted. -Medications: None -Foot redressed.  No follow-ups on file.

## 2023-05-07 ENCOUNTER — Ambulatory Visit (INDEPENDENT_AMBULATORY_CARE_PROVIDER_SITE_OTHER): Payer: Medicaid Other | Admitting: Podiatry

## 2023-05-07 DIAGNOSIS — M7989 Other specified soft tissue disorders: Secondary | ICD-10-CM

## 2023-05-07 DIAGNOSIS — Z9889 Other specified postprocedural states: Secondary | ICD-10-CM

## 2023-05-07 NOTE — Progress Notes (Signed)
Subjective:  Patient ID: Sabrina Bond, female    DOB: 1975-12-06,  MRN: [id number]  Chief Complaint  Patient presents with   Routine Post Op    POV # 2 DOS 04/15/23 --- RIGHT EXCISION OF ANKLE LIPOMA    DOS: 04/15/2023 Procedure: Right digit of lipoma  47 y.o. female returns for post-op check.  Patient states that she is doing well.  Minimal pain.  Bandages clean dry and intact.  Review of Systems: Negative except as noted in the HPI. Denies N/V/F/Ch.  Past Medical History:  Diagnosis Date   Arthritis    Diverticulitis    Hypertension     Current Outpatient Medications:    amLODipine (NORVASC) 5 MG tablet, Take by mouth., Disp: , Rfl:    atorvastatin (LIPITOR) 20 MG tablet, Take 1 tablet (20 mg total) by mouth daily., Disp: 30 tablet, Rfl: 2   carvedilol (COREG) 12.5 MG tablet, Take 1 tablet (12.5 mg total) by mouth 2 (two) times daily., Disp: 60 tablet, Rfl: 2   Cholecalciferol (VITAMIN D-3) 125 MCG (5000 UT) TABS, Take 1 tablet by mouth daily., Disp: 90 tablet, Rfl: 3   famotidine (PEPCID) 20 MG tablet, Take 20 mg by mouth as needed for heartburn or indigestion., Disp: , Rfl:    hydrOXYzine (ATARAX) 25 MG tablet, Take 1 tablet (25 mg total) by mouth 3 (three) times daily as needed for anxiety., Disp: 20 tablet, Rfl: 0   ibuprofen (ADVIL) 800 MG tablet, Take 1 tablet (800 mg total) by mouth every 6 (six) hours as needed., Disp: 60 tablet, Rfl: 1   ibuprofen (ADVIL) 800 MG tablet, Take 1 tablet (800 mg total) by mouth every 6 (six) hours as needed., Disp: 60 tablet, Rfl: 1   ibuprofen (ADVIL) 800 MG tablet, Take 1 tablet (800 mg total) by mouth every 6 (six) hours as needed., Disp: 60 tablet, Rfl: 1   losartan (COZAAR) 25 MG tablet, Take 1 tablet (25 mg total) by mouth daily., Disp: 30 tablet, Rfl: 0   meloxicam (MOBIC) 7.5 MG tablet, Take by mouth., Disp: , Rfl:    methocarbamol (ROBAXIN) 500 MG tablet, 1-2 tablets every 6 hours prn muscle spasms, Disp: 20 tablet, Rfl: 0   omeprazole  (PRILOSEC) 20 MG capsule, Take 20 mg by mouth as needed (acd reflux)., Disp: , Rfl:    oxyCODONE-acetaminophen (PERCOCET) 5-325 MG tablet, Take 1 tablet by mouth every 6 (six) hours as needed for severe pain., Disp: 15 tablet, Rfl: 0   oxyCODONE-acetaminophen (PERCOCET) 5-325 MG tablet, Take 1 tablet by mouth every 4 (four) hours as needed for severe pain., Disp: 30 tablet, Rfl: 0   oxyCODONE-acetaminophen (PERCOCET) 5-325 MG tablet, Take 1 tablet by mouth every 4 (four) hours as needed for severe pain., Disp: 30 tablet, Rfl: 0  Social History   Tobacco Use  Smoking Status Never  Smokeless Tobacco Never    Allergies  Allergen Reactions   Kiwi Extract Shortness Of Breath   Metoprolol Other (See Comments)    Chest pains-sharp and stabbing   Benicar [Olmesartan] Other (See Comments)    Chest pains   Peanut-Containing Drug Products Other (See Comments)    Heart flutters   Naproxen Itching and Rash   Objective:  There were no vitals filed for this visit. There is no height or weight on file to calculate BMI. Constitutional Well developed. Well nourished.  Vascular Foot warm and well perfused. Capillary refill normal to all digits.   Neurologic Normal speech. Oriented to person, place,  and time. Epicritic sensation to light touch grossly present bilaterally.  Dermatologic Skin completely epithelialized.  No signs of dehiscence noted.  Reduction of soft tissue mass noted.  No recurrence noted  Orthopedic: No further tenderness to palpation noted about the surgical site.   Radiographs: None Assessment:   No diagnosis found.  Plan:  Patient was evaluated and treated and all questions answered.  S/p foot surgery right -Clinically healed and officially discharged from my care if any foot and ankle issues arise in future she will come back and see me.  She states understanding.  No follow-ups on file.

## 2023-05-24 ENCOUNTER — Emergency Department
Admission: EM | Admit: 2023-05-24 | Discharge: 2023-05-24 | Disposition: A | Discharge status: Home / Self Care | Payer: Medicaid Other | Attending: Emergency Medicine | Admitting: Emergency Medicine

## 2023-05-24 ENCOUNTER — Emergency Department: Payer: Medicaid Other

## 2023-05-24 ENCOUNTER — Other Ambulatory Visit: Payer: Self-pay

## 2023-05-24 DIAGNOSIS — I10 Essential (primary) hypertension: Secondary | ICD-10-CM | POA: Insufficient documentation

## 2023-05-24 DIAGNOSIS — R110 Nausea: Secondary | ICD-10-CM | POA: Insufficient documentation

## 2023-05-24 DIAGNOSIS — R42 Dizziness and giddiness: Secondary | ICD-10-CM | POA: Insufficient documentation

## 2023-05-24 LAB — CBC WITH DIFFERENTIAL/PLATELET
Abs Immature Granulocytes: 0.02 10*3/uL (ref 0.00–0.07)
Basophils Absolute: 0 10*3/uL (ref 0.0–0.1)
Basophils Relative: 0 %
Eosinophils Absolute: 0.1 10*3/uL (ref 0.0–0.5)
Eosinophils Relative: 2 %
HCT: 34.4 % — ABNORMAL LOW (ref 36.0–46.0)
Hemoglobin: 11.4 g/dL — ABNORMAL LOW (ref 12.0–15.0)
Immature Granulocytes: 0 %
Lymphocytes Relative: 14 %
Lymphs Abs: 1 10*3/uL (ref 0.7–4.0)
MCH: 27.7 pg (ref 26.0–34.0)
MCHC: 33.1 g/dL (ref 30.0–36.0)
MCV: 83.5 fL (ref 80.0–100.0)
Monocytes Absolute: 0.5 10*3/uL (ref 0.1–1.0)
Monocytes Relative: 6 %
Neutro Abs: 5.7 10*3/uL (ref 1.7–7.7)
Neutrophils Relative %: 78 %
Platelets: 249 10*3/uL (ref 150–400)
RBC: 4.12 MIL/uL (ref 3.87–5.11)
RDW: 14.2 % (ref 11.5–15.5)
WBC: 7.3 10*3/uL (ref 4.0–10.5)
nRBC: 0 % (ref 0.0–0.2)

## 2023-05-24 LAB — LIPASE, BLOOD: Lipase: 42 U/L (ref 11–51)

## 2023-05-24 LAB — COMPREHENSIVE METABOLIC PANEL
ALT: 13 U/L (ref 0–44)
AST: 20 U/L (ref 15–41)
Albumin: 3.7 g/dL (ref 3.5–5.0)
Alkaline Phosphatase: 83 U/L (ref 38–126)
Anion gap: 12 (ref 5–15)
BUN: 14 mg/dL (ref 6–20)
CO2: 21 mmol/L — ABNORMAL LOW (ref 22–32)
Calcium: 9 mg/dL (ref 8.9–10.3)
Chloride: 105 mmol/L (ref 98–111)
Creatinine, Ser: 1.21 mg/dL — ABNORMAL HIGH (ref 0.44–1.00)
GFR, Estimated: 56 mL/min — ABNORMAL LOW (ref >60)
Glucose, Bld: 89 mg/dL (ref 70–99)
Potassium: 4.6 mmol/L (ref 3.5–5.1)
Sodium: 138 mmol/L (ref 135–145)
Total Bilirubin: 1 mg/dL (ref 0.3–1.2)
Total Protein: 7.3 g/dL (ref 6.5–8.1)

## 2023-05-24 MED ORDER — MECLIZINE HCL 12.5 MG PO TABS: 12.5000 mg | ORAL_TABLET | Freq: Three times a day (TID) | ORAL | 0 refills | Status: AC | PRN

## 2023-05-24 MED ORDER — ONDANSETRON 4 MG PO TBDP
4.0000 mg | ORAL_TABLET | Freq: Once | ORAL | Status: AC
  Administered 2023-05-24: 4 mg via ORAL
  Filled 2023-05-24: qty 1

## 2023-05-24 MED ORDER — ONDANSETRON 4 MG PO TBDP: 4.0000 mg | ORAL_TABLET | Freq: Two times a day (BID) | ORAL | 1 refills | Status: AC

## 2023-05-24 NOTE — ED Triage Notes (Signed)
Pt here POV for dizziness/lightheadedness. States her PCP is making BP medication adjustments and told her to come in if she experienced any lightheadedness. Denies LOC. Takes amlodipine and taken off carbetalol.

## 2023-05-24 NOTE — ED Provider Notes (Signed)
[facility] Provider Note    Event Date/Time   First MD Initiated Contact with Patient 05/24/23 1035     (approximate)   History   Hypertension and Dizziness   HPI  Pauleen Goleman is a 47 y.o. female   presents to the ED with complaint of dizziness and lightheadedness for last couple days.  She states that her PCP has been making adjustments with her blood pressure medication and currently she is taking the amlodipine daily. Prior to that she was taking carbetalol which was discontinued.  She complains of daily dizziness.  She also is taking Wegovy for the past month and reports nausea with this medication as well.  Patient has history of hypertension, diverticulitis and arthritis.      Physical Exam   Triage Vital Signs: ED Triage Vitals  Encounter Vitals Group     BP 05/24/23 1032 (!) 193/84     Systolic BP Percentile --      Diastolic BP Percentile --      Pulse Rate 05/24/23 1032 93     Resp 05/24/23 1032 18     Temp 05/24/23 1028 97.9 F (36.6 C)     Temp Source 05/24/23 1028 Oral     SpO2 05/24/23 1032 100 %     Weight 05/24/23 1029 225 lb (102.1 kg)     Height 05/24/23 1028 5\' 4"  (1.626 m)     Head Circumference --      Peak Flow --      Pain Score 05/24/23 1029 0     Pain Loc --      Pain Education --      Exclude from Growth Chart --     Most recent vital signs: Vitals:   05/24/23 1130 05/24/23 1243  BP: (!) 162/62 (!) 158/74  Pulse: 84 75  Resp:  18  Temp:    SpO2: 99% 100%     General: Awake, no distress.  Alert, talkative, answering questions appropriately. CV:  Good peripheral perfusion.  Heart regular rate and rhythm. Resp:  Normal effort.  Lungs are clear bilaterally. Abd:  No distention.  Other:  PERRLA, EOMI's without nystagmus.  EACs are clear.  TMs are without erythema or injection.  Neck is supple without cervical tenderness, no thoracic tenderness.  Tenderness is noted on palpation of the lower lumbar spine  approximately L5-S1 area and paravertebral muscles bilaterally.   ED Results / Procedures / Treatments   Labs (all labs ordered are listed, but only abnormal results are displayed) Labs Reviewed  CBC WITH DIFFERENTIAL/PLATELET - Abnormal; Notable for the following components:      Result Value   Hemoglobin 11.4 (*)    HCT 34.4 (*)    All other components within normal limits  COMPREHENSIVE METABOLIC PANEL - Abnormal; Notable for the following components:   CO2 21 (*)    Creatinine, Ser 1.21 (*)    GFR, Estimated 56 (*)    All other components within normal limits  LIPASE, BLOOD     EKG Vent. rate 72 BPM PR interval 168 ms QRS duration 68 ms QT/QTcB 396/433 ms P-R-T axes 57 26 50 Normal sinus rhythm Normal ECG     RADIOLOGY CT head per radiologist is negative for any acute intracranial abnormalities.    PROCEDURES:  Critical Care performed:   Procedures   MEDICATIONS ORDERED IN ED: Medications  ondansetron (ZOFRAN-ODT) disintegrating tablet 4 mg (4 mg Oral Given 05/24/23 1129)     IMPRESSION /  MDM / ASSESSMENT AND PLAN / ED COURSE  I reviewed the triage vital signs and the nursing notes.   Differential diagnosis includes, but is not limited to, intracranial lesion, inner ear, vertigo, hypertension, nausea secondary to [facility], anxiety.  47 year old female presents to the ED with complaint of dizziness and lightheadedness with anxiety about it being related to her blood pressure.  Patient reports that her PCP is adjusting her medication and currently she is taking amlodipine.  She also reports that she has been on [facility] for 1 month and daily has nausea.  Patient was given Zofran while in the ED which resolved her nausea.  Lab work was reassuring and blood pressure continued to decrease during the time she was here from 193/84-158/74 which patient still believed was extremely high.  I discussed blood pressure with her.  A prescription for meclizine 12.5 mg 3  times daily as needed for dizziness and Zofran twice daily as needed for nausea was sent to the pharmacy.  Patient has an appointment with her PCP in October and will discuss the nausea she is experiencing with the [facility].      Patient's presentation is most consistent with acute illness / injury with system symptoms.  FINAL CLINICAL IMPRESSION(S) / ED DIAGNOSES   Final diagnoses:  Dizziness  Nausea     Rx / DC Orders   ED Discharge Orders          Ordered    ondansetron (ZOFRAN-ODT) 4 MG disintegrating tablet  2 times daily        05/24/23 1329    meclizine (ANTIVERT) 12.5 MG tablet  3 times daily PRN        05/24/23 1329             Note:  This document was prepared using Dragon voice recognition software and may include unintentional dictation errors.   Tommi Rumps, PA-C 05/24/23 1427    Janith Lima, MD 05/25/23 [phone]

## 2023-05-24 NOTE — ED Notes (Signed)
See triage note  Presents with some dizziness  States she recently had a medication change her b/p  States she was doing ok until recently

## 2023-05-24 NOTE — ED Notes (Signed)
Pt states her sBP at home is 130s. States it goes up with doctor visits.

## 2023-05-24 NOTE — Discharge Instructions (Addendum)
Keep your appointment with your primary care provider as scheduled.  A prescription for Zofran was sent to the pharmacy to take as needed for nausea with Wegovy.  Antivert only as needed for dizziness.  Be aware that the Antivert could cause drowsiness and increase her risk for injury.  Lab work was normal as well as her CT scan.  Increase fluids to stay hydrated.

## 2023-06-06 ENCOUNTER — Ambulatory Visit (INDEPENDENT_AMBULATORY_CARE_PROVIDER_SITE_OTHER): Payer: Medicaid Other | Admitting: Podiatry

## 2023-06-06 ENCOUNTER — Encounter: Payer: Self-pay | Admitting: Podiatry

## 2023-06-06 VITALS — BP 179/78 | HR 77

## 2023-06-06 DIAGNOSIS — Z9889 Other specified postprocedural states: Secondary | ICD-10-CM

## 2023-06-06 DIAGNOSIS — M7989 Other specified soft tissue disorders: Secondary | ICD-10-CM

## 2023-06-06 NOTE — Progress Notes (Signed)
Subjective:  Patient ID: Sabrina Bond, female    DOB: 07-May-1976,  MRN: [id number]  Chief Complaint  Patient presents with   Post-op Problem    "I had surgery on my foot.  I don't know if it's scar tissue or what.  It had problems healing."    DOS: 04/15/2023 Procedure: Right digit of lipoma  47 y.o. female returns for post-op check.  Presents with concern for scar tissue by the incision.  She does states it does not hurt but just wanted to get it evaluated.  She denies any other acute complaints.  Review of Systems: Negative except as noted in the HPI. Denies N/V/F/Ch.  Past Medical History:  Diagnosis Date   Arthritis    Diverticulitis    Hypertension     Current Outpatient Medications:    amLODipine (NORVASC) 5 MG tablet, Take by mouth., Disp: , Rfl:    atorvastatin (LIPITOR) 20 MG tablet, Take 1 tablet (20 mg total) by mouth daily., Disp: 30 tablet, Rfl: 2   Cholecalciferol (VITAMIN D-3) 125 MCG (5000 UT) TABS, Take 1 tablet by mouth daily., Disp: 90 tablet, Rfl: 3   famotidine (PEPCID) 20 MG tablet, Take 20 mg by mouth as needed for heartburn or indigestion., Disp: , Rfl:    hydrOXYzine (ATARAX) 25 MG tablet, Take 1 tablet (25 mg total) by mouth 3 (three) times daily as needed for anxiety., Disp: 20 tablet, Rfl: 0   ibuprofen (ADVIL) 800 MG tablet, Take 1 tablet (800 mg total) by mouth every 6 (six) hours as needed., Disp: 60 tablet, Rfl: 1   ibuprofen (ADVIL) 800 MG tablet, Take 1 tablet (800 mg total) by mouth every 6 (six) hours as needed., Disp: 60 tablet, Rfl: 1   ibuprofen (ADVIL) 800 MG tablet, Take 1 tablet (800 mg total) by mouth every 6 (six) hours as needed., Disp: 60 tablet, Rfl: 1   meclizine (ANTIVERT) 12.5 MG tablet, Take 1 tablet (12.5 mg total) by mouth 3 (three) times daily as needed for dizziness., Disp: 20 tablet, Rfl: 0   ondansetron (ZOFRAN-ODT) 4 MG disintegrating tablet, Take 1 tablet (4 mg total) by mouth 2 (two) times daily., Disp: 20 tablet, Rfl: 1    losartan (COZAAR) 25 MG tablet, Take 1 tablet (25 mg total) by mouth daily., Disp: 30 tablet, Rfl: 0   meloxicam (MOBIC) 7.5 MG tablet, Take by mouth. (Patient not taking: Reported on 06/06/2023), Disp: , Rfl:    omeprazole (PRILOSEC) 20 MG capsule, Take 20 mg by mouth as needed (acd reflux). (Patient not taking: Reported on 06/06/2023), Disp: , Rfl:    oxyCODONE-acetaminophen (PERCOCET) 5-325 MG tablet, Take 1 tablet by mouth every 4 (four) hours as needed for severe pain. (Patient not taking: Reported on 06/06/2023), Disp: 30 tablet, Rfl: 0  Social History   Tobacco Use  Smoking Status Never  Smokeless Tobacco Never    Allergies  Allergen Reactions   Kiwi Extract Shortness Of Breath   Metoprolol Other (See Comments)    Chest pains-sharp and stabbing   Benicar [Olmesartan] Other (See Comments)    Chest pains   Peanut-Containing Drug Products Other (See Comments)    Heart flutters   Naproxen Itching and Rash   Objective:   Vitals:   06/06/23 1449  BP: (!) 179/78  Pulse: 77   There is no height or weight on file to calculate BMI. Constitutional Well developed. Well nourished.  Vascular Foot warm and well perfused. Capillary refill normal to all digits.   Neurologic  Normal speech. Oriented to person, place, and time. Epicritic sensation to light touch grossly present bilaterally.  Dermatologic Skin completely epithelialized.  No signs of dehiscence noted.  Reduction of soft tissue mass noted.  No recurrence noted  Orthopedic: No further tenderness to palpation noted about the surgical site.   Radiographs: None Assessment:   No diagnosis found.  Plan:  Patient was evaluated and treated and all questions answered.  S/p foot surgery right -Clinically most of her thickening of the skin and scar tissue likely due to shoe rubbing.  I discussed with her to discourage use of the shoes if it is causing her issues.  However it is not bothering her she just wanted to make sure  things healing okay.  Denies any other acute complaints  No follow-ups on file.

## 2023-08-06 ENCOUNTER — Ambulatory Visit (INDEPENDENT_AMBULATORY_CARE_PROVIDER_SITE_OTHER): Payer: Medicaid Other | Admitting: Podiatry

## 2023-08-06 ENCOUNTER — Encounter: Payer: Self-pay | Admitting: Podiatry

## 2023-08-06 VITALS — Ht 64.0 in | Wt 225.0 lb

## 2023-08-06 DIAGNOSIS — M7989 Other specified soft tissue disorders: Secondary | ICD-10-CM

## 2023-08-06 DIAGNOSIS — D492 Neoplasm of unspecified behavior of bone, soft tissue, and skin: Secondary | ICD-10-CM

## 2023-08-06 DIAGNOSIS — M6740 Ganglion, unspecified site: Secondary | ICD-10-CM

## 2023-08-06 NOTE — Progress Notes (Unsigned)
Subjective:  Patient ID: Sabrina Bond, female    DOB: [birth year],  MRN: [id number]  Chief Complaint  Patient presents with   Foot Pain    Pt is here due to right foot cyst that has formed after surgery to her foot, pt states the area is sore and can't wear tennis shoe on that foot due to pain.    47 y.o. female presents with the above complaint.  Patient presents with right ankle ganglion cyst.  She states that the cyst formed after the surgery.  It is near the surgical site.  She wanted get it evaluated.  Hurts with ambulation cane wearing tennis shoes.  She wants to discuss treatment options for pain scale 7 out of 10 dull aching nature.  Does not appear to be the same type of cyst that I had removed which was a lipoma   Review of Systems: Negative except as noted in the HPI. Denies N/V/F/Ch.  Past Medical History:  Diagnosis Date   Arthritis    Diverticulitis    Hypertension     Current Outpatient Medications:    amLODipine (NORVASC) 5 MG tablet, Take by mouth., Disp: , Rfl:    atorvastatin (LIPITOR) 20 MG tablet, Take 1 tablet (20 mg total) by mouth daily., Disp: 30 tablet, Rfl: 2   Cholecalciferol (VITAMIN D-3) 125 MCG (5000 UT) TABS, Take 1 tablet by mouth daily., Disp: 90 tablet, Rfl: 3   famotidine (PEPCID) 20 MG tablet, Take 20 mg by mouth as needed for heartburn or indigestion., Disp: , Rfl:    hydrOXYzine (ATARAX) 25 MG tablet, Take 1 tablet (25 mg total) by mouth 3 (three) times daily as needed for anxiety., Disp: 20 tablet, Rfl: 0   ibuprofen (ADVIL) 800 MG tablet, Take 1 tablet (800 mg total) by mouth every 6 (six) hours as needed., Disp: 60 tablet, Rfl: 1   ibuprofen (ADVIL) 800 MG tablet, Take 1 tablet (800 mg total) by mouth every 6 (six) hours as needed., Disp: 60 tablet, Rfl: 1   ibuprofen (ADVIL) 800 MG tablet, Take 1 tablet (800 mg total) by mouth every 6 (six) hours as needed., Disp: 60 tablet, Rfl: 1   meclizine (ANTIVERT) 12.5 MG tablet, Take 1 tablet (12.5 mg  total) by mouth 3 (three) times daily as needed for dizziness., Disp: 20 tablet, Rfl: 0   meloxicam (MOBIC) 7.5 MG tablet, Take by mouth., Disp: , Rfl:    omeprazole (PRILOSEC) 20 MG capsule, Take 20 mg by mouth as needed (acd reflux)., Disp: , Rfl:    ondansetron (ZOFRAN-ODT) 4 MG disintegrating tablet, Take 1 tablet (4 mg total) by mouth 2 (two) times daily., Disp: 20 tablet, Rfl: 1   oxyCODONE-acetaminophen (PERCOCET) 5-325 MG tablet, Take 1 tablet by mouth every 4 (four) hours as needed for severe pain., Disp: 30 tablet, Rfl: 0   losartan (COZAAR) 25 MG tablet, Take 1 tablet (25 mg total) by mouth daily., Disp: 30 tablet, Rfl: 0  Social History   Tobacco Use  Smoking Status Never  Smokeless Tobacco Never    Allergies  Allergen Reactions   Kiwi Extract Shortness Of Breath   Metoprolol Other (See Comments)    Chest pains-sharp and stabbing   Benicar [Olmesartan] Other (See Comments)    Chest pains   Peanut-Containing Drug Products Other (See Comments)    Heart flutters   Naproxen Itching and Rash   Objective:  There were no vitals filed for this visit. Body mass index is 38.62 kg/m. Constitutional Well  developed. Well nourished.  Vascular Dorsalis pedis pulses palpable bilaterally. Posterior tibial pulses palpable bilaterally. Capillary refill normal to all digits.  No cyanosis or clubbing noted. Pedal hair growth normal.  Neurologic Normal speech. Oriented to person, place, and time. Epicritic sensation to light touch grossly present bilaterally.  Dermatologic Nails well groomed and normal in appearance. No open wounds. No skin lesions.  Orthopedic: Pain on palpation right ankle mobile single lobulated soft tissue mass/ganglion cyst noted.  Appears to be fluid field.  Positive transilluminates.  Not indurated.   Radiographs: None Assessment:   1. Soft tissue mass   2. Ganglion cyst    Plan:  Patient was evaluated and treated and all questions answered.  Right  ganglion cyst ankle -All questions and concerns were discussed with the patient in extensive detail.  Given that this is a new cyst formation that occurred from the previous removed soft tissue mass/lipoma.  It could have been from space created from removal of previous mass which backfilled with ganglion cyst.  I discussed with patient at this time patient will benefit from aspiration and drainage of the cyst.  Patient agrees with plan like to proceed.  Aspiration and drainage Aspiration and drainage of ganglion cyst Skin was prepped in standard technique with Betadine.  One-to-one mixture of 1% lidocaine plain half percent Marcaine plain was injected circumferentially in V-block fashion 3 cc.  18-gauge needle was used to puncture the skin into the ganglion cyst at this time it is important note that gelatinous material was removed consistent with ganglion cyst about 5 cc.  Decreased of cyst noted and improvement of symptoms.  The insertion site was covered with triple antibiotic and a Band-Aid.  -If there is a recurrence we discussed surgical excision  No follow-ups on file.

## 2023-12-20 ENCOUNTER — Other Ambulatory Visit: Payer: Self-pay | Admitting: Family Medicine

## 2023-12-20 DIAGNOSIS — Z1231 Encounter for screening mammogram for malignant neoplasm of breast: Secondary | ICD-10-CM

## 2024-01-07 ENCOUNTER — Ambulatory Visit
Admission: RE | Admit: 2024-01-07 | Discharge: 2024-01-07 | Disposition: A | Discharge status: Home / Self Care | Source: Ambulatory Visit | Attending: Family Medicine | Admitting: Family Medicine

## 2024-01-07 ENCOUNTER — Encounter: Payer: Self-pay | Admitting: Radiology

## 2024-01-07 DIAGNOSIS — Z1231 Encounter for screening mammogram for malignant neoplasm of breast: Secondary | ICD-10-CM | POA: Insufficient documentation

## 2024-01-09 ENCOUNTER — Ambulatory Visit: Admitting: Podiatry

## 2024-01-09 DIAGNOSIS — M722 Plantar fascial fibromatosis: Secondary | ICD-10-CM | POA: Diagnosis not present

## 2024-01-09 DIAGNOSIS — M62461 Contracture of muscle, right lower leg: Secondary | ICD-10-CM | POA: Diagnosis not present

## 2024-01-09 NOTE — Progress Notes (Signed)
 Subjective:  Patient ID: Sabrina Bond, female    DOB: Jan 18, 1976,  MRN: [id number]  Chief Complaint  Patient presents with   cyst    Pt stated that she has a cyst on her right foot     48 y.o. female presents with the above complaint.  Patient presents with right heel pain that has been off for quite some time is progressed gotten worse worse with ambulation hurts with pressure she wanted get it evaluated she is it hurts with taking for step in the morning.  Progressively gets worse pain scale 7 out of 10 dull aching nature.   Review of Systems: Negative except as noted in the HPI. Denies N/V/F/Ch.  Past Medical History:  Diagnosis Date   Arthritis    Diverticulitis    Hypertension     Current Outpatient Medications:    amLODipine (NORVASC) 5 MG tablet, Take by mouth., Disp: , Rfl:    atorvastatin  (LIPITOR) 20 MG tablet, Take 1 tablet (20 mg total) by mouth daily., Disp: 30 tablet, Rfl: 2   Cholecalciferol (VITAMIN D -3) 125 MCG (5000 UT) TABS, Take 1 tablet by mouth daily., Disp: 90 tablet, Rfl: 3   famotidine  (PEPCID ) 20 MG tablet, Take 20 mg by mouth as needed for heartburn or indigestion., Disp: , Rfl:    hydrOXYzine  (ATARAX ) 25 MG tablet, Take 1 tablet (25 mg total) by mouth 3 (three) times daily as needed for anxiety., Disp: 20 tablet, Rfl: 0   ibuprofen  (ADVIL ) 800 MG tablet, Take 1 tablet (800 mg total) by mouth every 6 (six) hours as needed., Disp: 60 tablet, Rfl: 1   ibuprofen  (ADVIL ) 800 MG tablet, Take 1 tablet (800 mg total) by mouth every 6 (six) hours as needed., Disp: 60 tablet, Rfl: 1   ibuprofen  (ADVIL ) 800 MG tablet, Take 1 tablet (800 mg total) by mouth every 6 (six) hours as needed., Disp: 60 tablet, Rfl: 1   losartan  (COZAAR ) 25 MG tablet, Take 1 tablet (25 mg total) by mouth daily., Disp: 30 tablet, Rfl: 0   meclizine  (ANTIVERT ) 12.5 MG tablet, Take 1 tablet (12.5 mg total) by mouth 3 (three) times daily as needed for dizziness., Disp: 20 tablet, Rfl: 0    omeprazole (PRILOSEC) 20 MG capsule, Take 20 mg by mouth as needed (acd reflux)., Disp: , Rfl:    ondansetron  (ZOFRAN -ODT) 4 MG disintegrating tablet, Take 1 tablet (4 mg total) by mouth 2 (two) times daily., Disp: 20 tablet, Rfl: 1   oxyCODONE -acetaminophen  (PERCOCET) 5-325 MG tablet, Take 1 tablet by mouth every 4 (four) hours as needed for severe pain., Disp: 30 tablet, Rfl: 0  Social History   Tobacco Use  Smoking Status Never  Smokeless Tobacco Never    Allergies  Allergen Reactions   Kiwi Extract Shortness Of Breath   Metoprolol Other (See Comments)    Chest pains-sharp and stabbing   Benicar [Olmesartan] Other (See Comments)    Chest pains   Peanut-Containing Drug Products Other (See Comments)    Heart flutters   Naproxen Itching and Rash   Objective:  There were no vitals filed for this visit. There is no height or weight on file to calculate BMI. Constitutional Well developed. Well nourished.  Vascular Dorsalis pedis pulses palpable bilaterally. Posterior tibial pulses palpable bilaterally. Capillary refill normal to all digits.  No cyanosis or clubbing noted. Pedal hair growth normal.  Neurologic Normal speech. Oriented to person, place, and time. Epicritic sensation to light touch grossly present bilaterally.  Dermatologic Nails  well groomed and normal in appearance. No open wounds. No skin lesions.  Orthopedic: Normal joint ROM without pain or crepitus bilaterally. No visible deformities. Tender to palpation at the calcaneal tuber right. No pain with calcaneal squeeze right. Ankle ROM diminished range of motion right. Silfverskiold Test: positive right.   Radiographs: None  Assessment:   1. Plantar fasciitis of right foot   2. Gastrocnemius equinus, right    Plan:  Patient was evaluated and treated and all questions answered.  Plantar Fasciitis, right with underlying gastrocnemius equinus - XR reviewed as above.  - Educated on icing and stretching.  Instructions given.  - Injection delivered to the plantar fascia as below. - DME: Plantar fascial brace dispensed to support the medial longitudinal arch of the foot and offload pressure from the heel and prevent arch collapse during weightbearing - Pharmacologic management: None  Procedure: Injection Tendon/Ligament Location: Right plantar fascia at the glabrous junction; medial approach. Skin Prep: alcohol Injectate: 0.5 cc 0.5% marcaine plain, 0.5 cc of 1% Lidocaine, 0.5 cc kenalog 10. Disposition: Patient tolerated procedure well. Injection site dressed with a band-aid.  No follow-ups on file.

## 2024-04-26 ENCOUNTER — Emergency Department

## 2024-04-26 ENCOUNTER — Encounter: Payer: Self-pay | Admitting: Emergency Medicine

## 2024-04-26 ENCOUNTER — Other Ambulatory Visit: Payer: Self-pay

## 2024-04-26 ENCOUNTER — Emergency Department
Admission: EM | Admit: 2024-04-26 | Discharge: 2024-04-26 | Disposition: A | Discharge status: Home / Self Care | Attending: Emergency Medicine | Admitting: Emergency Medicine

## 2024-04-26 DIAGNOSIS — W1839XA Other fall on same level, initial encounter: Secondary | ICD-10-CM | POA: Diagnosis not present

## 2024-04-26 DIAGNOSIS — I129 Hypertensive chronic kidney disease with stage 1 through stage 4 chronic kidney disease, or unspecified chronic kidney disease: Secondary | ICD-10-CM | POA: Insufficient documentation

## 2024-04-26 DIAGNOSIS — R55 Syncope and collapse: Secondary | ICD-10-CM | POA: Insufficient documentation

## 2024-04-26 DIAGNOSIS — R1033 Periumbilical pain: Secondary | ICD-10-CM | POA: Diagnosis present

## 2024-04-26 DIAGNOSIS — W19XXXA Unspecified fall, initial encounter: Secondary | ICD-10-CM

## 2024-04-26 DIAGNOSIS — J45909 Unspecified asthma, uncomplicated: Secondary | ICD-10-CM | POA: Diagnosis not present

## 2024-04-26 DIAGNOSIS — Y9222 Religious institution as the place of occurrence of the external cause: Secondary | ICD-10-CM | POA: Insufficient documentation

## 2024-04-26 DIAGNOSIS — N189 Chronic kidney disease, unspecified: Secondary | ICD-10-CM | POA: Insufficient documentation

## 2024-04-26 LAB — COMPREHENSIVE METABOLIC PANEL WITH GFR
ALT: 11 U/L (ref 0–44)
AST: 20 U/L (ref 15–41)
Albumin: 4 g/dL (ref 3.5–5.0)
Alkaline Phosphatase: 86 U/L (ref 38–126)
Anion gap: 9 (ref 5–15)
BUN: 31 mg/dL — ABNORMAL HIGH (ref 6–20)
CO2: 20 mmol/L — ABNORMAL LOW (ref 22–32)
Calcium: 9.2 mg/dL (ref 8.9–10.3)
Chloride: 109 mmol/L (ref 98–111)
Creatinine, Ser: 1.5 mg/dL — ABNORMAL HIGH (ref 0.44–1.00)
GFR, Estimated: 43 mL/min — ABNORMAL LOW
Glucose, Bld: 129 mg/dL — ABNORMAL HIGH (ref 70–99)
Potassium: 4.3 mmol/L (ref 3.5–5.1)
Sodium: 138 mmol/L (ref 135–145)
Total Bilirubin: 0.7 mg/dL (ref 0.0–1.2)
Total Protein: 7.9 g/dL (ref 6.5–8.1)

## 2024-04-26 LAB — URINALYSIS, W/ REFLEX TO CULTURE (INFECTION SUSPECTED)
Bacteria, UA: NONE SEEN
Bilirubin Urine: NEGATIVE
Glucose, UA: NEGATIVE mg/dL
Hgb urine dipstick: NEGATIVE
Ketones, ur: NEGATIVE mg/dL
Leukocytes,Ua: NEGATIVE
Nitrite: NEGATIVE
Protein, ur: NEGATIVE mg/dL
Specific Gravity, Urine: 1.011 (ref 1.005–1.030)
pH: 6 (ref 5.0–8.0)

## 2024-04-26 LAB — TROPONIN I (HIGH SENSITIVITY)
Troponin I (High Sensitivity): 4 ng/L
Troponin I (High Sensitivity): 4 ng/L (ref <18)

## 2024-04-26 LAB — CBC WITH DIFFERENTIAL/PLATELET
Abs Immature Granulocytes: 0.05 K/uL (ref 0.00–0.07)
Basophils Absolute: 0 K/uL (ref 0.0–0.1)
Basophils Relative: 0 %
Eosinophils Absolute: 0.1 K/uL (ref 0.0–0.5)
Eosinophils Relative: 1 %
HCT: 31.4 % — ABNORMAL LOW (ref 36.0–46.0)
Hemoglobin: 10.5 g/dL — ABNORMAL LOW (ref 12.0–15.0)
Immature Granulocytes: 0 %
Lymphocytes Relative: 8 %
Lymphs Abs: 1 K/uL (ref 0.7–4.0)
MCH: 27.7 pg (ref 26.0–34.0)
MCHC: 33.4 g/dL (ref 30.0–36.0)
MCV: 82.8 fL (ref 80.0–100.0)
Monocytes Absolute: 0.6 K/uL (ref 0.1–1.0)
Monocytes Relative: 5 %
Neutro Abs: 10.3 K/uL — ABNORMAL HIGH (ref 1.7–7.7)
Neutrophils Relative %: 86 %
Platelets: 274 K/uL (ref 150–400)
RBC: 3.79 MIL/uL — ABNORMAL LOW (ref 3.87–5.11)
RDW: 14 % (ref 11.5–15.5)
WBC: 12 K/uL — ABNORMAL HIGH (ref 4.0–10.5)
nRBC: 0 % (ref 0.0–0.2)

## 2024-04-26 LAB — HCG, QUANTITATIVE, PREGNANCY: hCG, Beta Chain, Quant, S: 1 m[IU]/mL (ref <5)

## 2024-04-26 LAB — LACTIC ACID, PLASMA: Lactic Acid, Venous: 1.5 mmol/L (ref 0.5–1.9)

## 2024-04-26 MED ORDER — MORPHINE SULFATE (PF) 2 MG/ML IV SOLN
2.0000 mg | Freq: Once | INTRAVENOUS | Status: AC
  Administered 2024-04-26: 2 mg via INTRAVENOUS
  Filled 2024-04-26: qty 1

## 2024-04-26 MED ORDER — SODIUM CHLORIDE 0.9 % IV BOLUS
1000.0000 mL | Freq: Once | INTRAVENOUS | Status: AC
  Administered 2024-04-26: 1000 mL via INTRAVENOUS

## 2024-04-26 MED ORDER — IOHEXOL 350 MG/ML SOLN
75.0000 mL | Freq: Once | INTRAVENOUS | Status: AC | PRN
  Administered 2024-04-26: 75 mL via INTRAVENOUS

## 2024-04-26 NOTE — ED Provider Notes (Addendum)
 [facility] Provider Note    Event Date/Time   First MD Initiated Contact with Patient 04/26/24 1259     (approximate)   History   Abdominal Pain   HPI  Sabrina Bond is a 48 y.o. female with history of asthma, hypertension, mid aortic syndrome, CKD, presenting with syncopal episode as well as abdominal pain.  Patient states that she was at church, felt some abdominal discomfort in the periumbilical region, felt lightheaded, thought that she needed to stool, went to the bathroom, lowered herself to the ground because she felt quite lightheaded.  Was found after she passed out, laying on her right side.  By time EMS got there she was in a wheelchair.  Patient states that she took all her blood pressure medications this morning, did eat 2 small tangerines that she thinks might not be sufficient for her.  States that she is no chest pain or shortness of breath, no headache or vision changes, no urinary symptoms, no nausea vomiting or diarrhea, has the periumbilical abdominal pain but is nonradiating, no going to her back, no weakness or numbness focally, no tingling to her extremities.  Per independent history from EMS, she has not no acute distress and ANO x 4 for them.  On independent review, she is followed by nephrology for her mid aortic syndrome as well as her CKD.  Last seen by them at the end of July.  Her renal arteries are patent.  Her creatinine in May was 1.5.     Physical Exam   Triage Vital Signs: ED Triage Vitals  Encounter Vitals Group     BP --      Girls Systolic BP Percentile --      Girls Diastolic BP Percentile --      Boys Systolic BP Percentile --      Boys Diastolic BP Percentile --      Pulse --      Resp --      Temp --      Temp src --      SpO2 --      Weight 04/26/24 1304 211 lb 11.2 oz (96 kg)     Height 04/26/24 1304 5' 4 (1.626 m)     Head Circumference --      Peak Flow --      Pain Score 04/26/24 1305 10     Pain Loc --       Pain Education --      Exclude from Growth Chart --     Most recent vital signs: Vitals:   04/26/24 1308  BP: 136/78  Pulse: 67  Resp: 18  Temp: 98.1 F (36.7 C)  SpO2: 100%     General: Awake, no distress.  CV:  Good peripheral perfusion.  Resp:  Normal effort.  No tachypnea or respiratory distress Abd:  No distention.  Soft, tender to the periumbilical region without guarding Other:  No CVA tenderness bilaterally, no palpable skull deformities or tenderness, no tenderness to extremities, equal radial and DP pulses bilaterally, no focal weakness or numbness, no facial tenderness or facial droop, no slurred speech, pupils are equal.   ED Results / Procedures / Treatments   Labs (all labs ordered are listed, but only abnormal results are displayed) Labs Reviewed  COMPREHENSIVE METABOLIC PANEL WITH GFR - Abnormal; Notable for the following components:      Result Value   CO2 20 (*)    Glucose, Bld 129 (*)  BUN 31 (*)    Creatinine, Ser 1.50 (*)    GFR, Estimated 43 (*)    All other components within normal limits  CBC WITH DIFFERENTIAL/PLATELET - Abnormal; Notable for the following components:   WBC 12.0 (*)    RBC 3.79 (*)    Hemoglobin 10.5 (*)    HCT 31.4 (*)    Neutro Abs 10.3 (*)    All other components within normal limits  URINALYSIS, W/ REFLEX TO CULTURE (INFECTION SUSPECTED) - Abnormal; Notable for the following components:   Color, Urine YELLOW (*)    APPearance HAZY (*)    All other components within normal limits  LACTIC ACID, PLASMA  HCG, QUANTITATIVE, PREGNANCY  TROPONIN I (HIGH SENSITIVITY)  TROPONIN I (HIGH SENSITIVITY)     EKG  EKG shows, sinus rhythm, rate 80, normal QS, QTc is 497, no obvious ischemic ST elevation, QTc slightly prolonged compared to prior.   RADIOLOGY On my independent interpretation, CT without obvious dissection   PROCEDURES:  Critical Care performed: No  Procedures   MEDICATIONS ORDERED IN  ED: Medications  sodium chloride  0.9 % bolus 1,000 mL (0 mLs Intravenous Stopped 04/26/24 1527)  morphine  (PF) 2 MG/ML injection 2 mg (2 mg Intravenous Given 04/26/24 1326)  iohexol  (OMNIPAQUE ) 350 MG/ML injection 75 mL (75 mLs Intravenous Contrast Given 04/26/24 1427)     IMPRESSION / MDM / ASSESSMENT AND PLAN / ED COURSE  I reviewed the triage vital signs and the nursing notes.                              Differential diagnosis includes, but is not limited to, colitis, diverticulitis, GERD, acid reflux, aortic etiology, vasovagal, arrhythmia, dehydration.  Will get labs, EKG, troponin, UA, fluids, CT angio dissection protocol, given her fall and being found on the ground, CT head to make sure there is no intracranial hemorrhage.  Patient's presentation is most consistent with acute presentation with potential threat to life or bodily function.  Independent interpretation of labs and imaging below.  Patient signed out pending repeat troponin and imaging results.  If negative likely able to be discharged home with outpatient cardiology follow-up.  The patient is on the cardiac monitor to evaluate for evidence of arrhythmia and/or significant heart rate changes.   Clinical Course as of 04/26/24 1527  Sun Apr 26, 2024  1431 Independent review of labs, lactate not elevated, she has mild leukocytosis, electrolytes not severely deranged, creatinine is 1.5 which is similar to her last creatinine on independent chart review, UA is not consistent with UTI, hCG is not elevated, troponin is not elevated. [TT]  1522 CT Angio Chest/Abd/Pel for Dissection W and/or W/WO IMPRESSION: 1. No acute abnormality or evidence of acute aortic syndrome. 2. Mild aortic atherosclerosis. Smooth narrowing of the mid abdominal aorta. 3. Cholecystectomy with biliary dilatation, the common bile duct measures 13 mm. This is likely related to prior cholecystectomy. Recommend correlation with LFTS. 4. A prominent central  pulmonary artery can be seen in the setting of pulmonary arterial hypertension.   [TT]  1522 CT Head Wo Contrast 1. No acute intracranial abnormality.  [TT]  1527 On reassessment patient states her pain is feeling a lot better, still has some nausea and epigastric abdominal pain on exam.  CT noted dilated CBD to 13 mm, will get an MRCP. [TT]    Clinical Course User Index [TT] Waymond Lorelle Cummins, MD     FINAL  CLINICAL IMPRESSION(S) / ED DIAGNOSES   Final diagnoses:  Periumbilical abdominal pain  Syncope, unspecified syncope type  Fall, initial encounter     Rx / DC Orders   ED Discharge Orders          Ordered    Ambulatory referral to Cardiology       Comments: If you have not heard from the Cardiology office within the next 72 hours please call [phone].   04/26/24 1517             Note:  This document was prepared using Dragon voice recognition software and may include unintentional dictation errors.    Waymond Lorelle Cummins, MD 04/26/24 1517    Waymond Lorelle Cummins, MD 04/26/24 1535    Waymond Lorelle Cummins, MD 04/26/24 [phone]

## 2024-04-26 NOTE — ED Provider Notes (Signed)
-----------------------------------------   3:09 PM on 04/26/2024 -----------------------------------------  Blood pressure 136/78, pulse 67, temperature 98.1 F (36.7 C), temperature source Oral, resp. rate 18, height 5' 4 (1.626 m), weight 96 kg, SpO2 100%.  Assuming care from Dr. Waymond.  In short, Sabrina Bond is a 48 y.o. female with a chief complaint of Abdominal Pain .  Refer to the original H&P for additional details.  The current plan of care is to follow-up repeat troponin and CTA chest/abdomen/pelvis.  ----------------------------------------- 7:56 PM on 04/26/2024 ----------------------------------------- MRCP shows no evidence of choledocholithiasis or other acute finding, repeat troponin within normal limits.  Patient is feeling well on reassessment and is appropriate for discharge home with outpatient follow-up, was counseled to return to the ED for new or worsening symptoms.  Patient agrees with plan.   Willo Dunnings, MD 04/26/24 [phone]

## 2024-04-26 NOTE — ED Triage Notes (Signed)
 Pt via [facility] from church. Pt c/o syncopal episode while using the bathroom. States she felt like she about to pass out and eased herself to the floor. Pt c/o squeezing epigastic pain prior to the syncopal episode. Pt has a hx of CKD and mid aortic syndrome Pt is A&Ox4 and NAD

## 2024-08-23 ENCOUNTER — Emergency Department

## 2024-08-23 ENCOUNTER — Encounter: Payer: Self-pay | Admitting: Emergency Medicine

## 2024-08-23 ENCOUNTER — Emergency Department
Admission: EM | Admit: 2024-08-23 | Discharge: 2024-08-23 | Disposition: A | Discharge status: Home / Self Care | Attending: Emergency Medicine | Admitting: Emergency Medicine

## 2024-08-23 ENCOUNTER — Other Ambulatory Visit: Payer: Self-pay

## 2024-08-23 DIAGNOSIS — I10 Essential (primary) hypertension: Secondary | ICD-10-CM | POA: Diagnosis not present

## 2024-08-23 DIAGNOSIS — R0789 Other chest pain: Secondary | ICD-10-CM | POA: Insufficient documentation

## 2024-08-23 DIAGNOSIS — M5412 Radiculopathy, cervical region: Secondary | ICD-10-CM | POA: Diagnosis not present

## 2024-08-23 DIAGNOSIS — R202 Paresthesia of skin: Secondary | ICD-10-CM | POA: Diagnosis present

## 2024-08-23 LAB — CBC
HCT: 32.4 % — ABNORMAL LOW (ref 36.0–46.0)
Hemoglobin: 10.6 g/dL — ABNORMAL LOW (ref 12.0–15.0)
MCH: 27.5 pg (ref 26.0–34.0)
MCHC: 32.7 g/dL (ref 30.0–36.0)
MCV: 84.2 fL (ref 80.0–100.0)
Platelets: 233 K/uL (ref 150–400)
RBC: 3.85 MIL/uL — ABNORMAL LOW (ref 3.87–5.11)
RDW: 13.4 % (ref 11.5–15.5)
WBC: 8.2 K/uL (ref 4.0–10.5)
nRBC: 0 % (ref 0.0–0.2)

## 2024-08-23 LAB — BASIC METABOLIC PANEL WITH GFR
Anion gap: 14 (ref 5–15)
BUN: 29 mg/dL — ABNORMAL HIGH (ref 6–20)
CO2: 21 mmol/L — ABNORMAL LOW (ref 22–32)
Calcium: 9.2 mg/dL (ref 8.9–10.3)
Chloride: 104 mmol/L (ref 98–111)
Creatinine, Ser: 1.61 mg/dL — ABNORMAL HIGH (ref 0.44–1.00)
GFR, Estimated: 39 mL/min — ABNORMAL LOW
Glucose, Bld: 149 mg/dL — ABNORMAL HIGH (ref 70–99)
Potassium: 4.2 mmol/L (ref 3.5–5.1)
Sodium: 138 mmol/L (ref 135–145)

## 2024-08-23 LAB — TROPONIN T, HIGH SENSITIVITY: Troponin T High Sensitivity: <15 ng/L (ref 0–19)

## 2024-08-23 MED ORDER — CYCLOBENZAPRINE HCL 10 MG PO TABS: 10.0000 mg | ORAL_TABLET | Freq: Three times a day (TID) | ORAL | 0 refills | Status: AC | PRN

## 2024-08-23 MED ORDER — CHLORTHALIDONE 25 MG PO TABS: 25.0000 mg | ORAL_TABLET | Freq: Every day | ORAL | 0 refills | Status: AC

## 2024-08-23 NOTE — Discharge Instructions (Addendum)
 Please increase your chlorthalidone  from 12.5 mg to 25 mg every morning.  Please check your blood pressure daily and follow-up with your primary care physician for further management of these blood pressure medications

## 2024-08-23 NOTE — ED Provider Notes (Signed)
 "  [facility] Provider Note   Event Date/Time   First MD Initiated Contact with Patient 08/23/24 1350     (approximate) History  Chest Pain  HPI Sabrina Bond is a 48 y.o. female listed past medical history of arthritis, hypertension, and diverticulitis who presents complaining of left upper chest wall pain that radiates down the left arm and is associated with left neck pain.  Patient states that this happened while she was driving and dancing to music in her car.  Patient states that this pain occurred for approximately 20 minutes and resolved slowly over the next 5 minutes.  Patient denies any pain at this time.  Patient does endorse associated nausea that has since resolved.  Patient states that this pain is somewhat reproducible when she moves her neck in a certain direction.  Patient describes a sharp pain to the left anterior axillary region that radiates down the left arm as paresthesias. ROS: Patient currently denies any vision changes, tinnitus, difficulty speaking, facial droop, sore throat, shortness of breath, abdominal pain, nausea/vomiting/diarrhea, dysuria, or weakness/numbness in any extremity   Physical Exam  Triage Vital Signs: ED Triage Vitals  Encounter Vitals Group     BP 08/23/24 1307 (!) 208/74     Girls Systolic BP Percentile --      Girls Diastolic BP Percentile --      Boys Systolic BP Percentile --      Boys Diastolic BP Percentile --      Pulse Rate 08/23/24 1307 (!) 116     Resp 08/23/24 1307 20     Temp 08/23/24 1307 (!) 97.4 F (36.3 C)     Temp Source 08/23/24 1307 Oral     SpO2 08/23/24 1307 100 %     Weight 08/23/24 1307 211 lb 10.3 oz (96 kg)     Height 08/23/24 1307 5' 4 (1.626 m)     Head Circumference --      Peak Flow --      Pain Score 08/23/24 1306 0     Pain Loc --      Pain Education --      Exclude from Growth Chart --    Most recent vital signs: Vitals:   08/23/24 1412 08/23/24 1413  BP:  (!) 173/63  Pulse: 80    Resp:    Temp:  97.8 F (36.6 C)  SpO2: 100%    General: Awake, oriented x4. CV:  Good peripheral perfusion.  No MGR Resp:  Normal effort.  CTAB Abd:  No distention. Other:  Middle-aged obese African-American female resting comfortably in no acute distress.  Nontender to palpation.  Negative Spurling test on the left ED Results / Procedures / Treatments  Labs (all labs ordered are listed, but only abnormal results are displayed) Labs Reviewed  BASIC METABOLIC PANEL WITH GFR - Abnormal; Notable for the following components:      Result Value   CO2 21 (*)    Glucose, Bld 149 (*)    BUN 29 (*)    Creatinine, Ser 1.61 (*)    GFR, Estimated 39 (*)    All other components within normal limits  CBC - Abnormal; Notable for the following components:   RBC 3.85 (*)    Hemoglobin 10.6 (*)    HCT 32.4 (*)    All other components within normal limits  TROPONIN T, HIGH SENSITIVITY   EKG ED ECG REPORT I, Artist MARLA Kerns, the attending physician, personally viewed and interpreted  this ECG. Date: 08/23/2024 EKG Time: 1308 Rate: 109 Rhythm: Tachycardic sinus rhythm QRS Axis: normal Intervals: normal ST/T Wave abnormalities: normal Narrative Interpretation: Tachycardic sinus rhythm.  No evidence of acute ischemia RADIOLOGY ED MD interpretation: 2 view chest x-ray interpreted by me shows no evidence of acute abnormalities including no pneumonia, pneumothorax, or widened mediastinum - All radiology independently interpreted and agree with radiology assessment Official radiology report(s): No results found. PROCEDURES: Critical Care performed: No Procedures MEDICATIONS ORDERED IN ED: Medications - No data to display IMPRESSION / MDM / ASSESSMENT AND PLAN / ED COURSE  I reviewed the triage vital signs and the nursing notes.                             The patient is on the cardiac monitor to evaluate for evidence of arrhythmia and/or significant heart rate changes. Patient's  presentation is most consistent with acute presentation with potential threat to life or bodily function. Patient is a 48 year old female with the above-stated past medical history who presents complaining of left upper chest wall pain that radiates out to the left arm intermittently.  Patient symptoms have resolved at this time but were associated with mild nausea. DDx: ACS, arrhythmia, cervical radiculopathy, PE, pneumothorax Plan: CBC, BMP, troponin, chest x-ray, EKG  Based on exam, radiologic evaluation, and laboratory evaluation, patient shows no red flag symptomatology at this time.  Patient's troponin negative x 1.  Patient's EKG only showed mild sinus tachycardia that resolved on my evaluation.  Patient does continue to show hypertension and therefore we will increase patient's chlorthalidone  to 25 mg every morning.  Patient agrees with plan for discharge at this time with outpatient PCP follow-up.  Patient given strict return precautions and all questions answered prior to discharge  Dispo: Discharge home with PCP follow-up   FINAL CLINICAL IMPRESSION(S) / ED DIAGNOSES   Final diagnoses:  Left-sided chest wall pain  Paresthesia of left arm  Cervical radiculopathy   Rx / DC Orders   ED Discharge Orders          Ordered    cyclobenzaprine  (FLEXERIL ) 10 MG tablet  3 times daily PRN        08/23/24 1403    chlorthalidone  (HYGROTON ) 25 MG tablet  Daily        08/23/24 1426           Note:  This document was prepared using Dragon voice recognition software and may include unintentional dictation errors.   Jossie Artist POUR, MD 08/23/24 [phone]  "

## 2024-08-23 NOTE — ED Triage Notes (Signed)
 Pt endorses left sided CP that radiates down left arm. Pain started about 20 min ago and lasted about 5 min, no pain now. Endorses nausea.

## 2024-09-16 ENCOUNTER — Other Ambulatory Visit: Payer: Self-pay | Admitting: Podiatry
